# Patient Record
Sex: Female | Born: 1975 | Race: White | Hispanic: No | Smoking: Never smoker
Health system: Southern US, Community
[De-identification: ages and names within clinical notes are randomized; demographics above are authoritative.]

## PROBLEM LIST (undated history)

## (undated) ENCOUNTER — Inpatient Hospital Stay (HOSPITAL_COMMUNITY): Payer: Self-pay

## (undated) DIAGNOSIS — D219 Benign neoplasm of connective and other soft tissue, unspecified: Secondary | ICD-10-CM

## (undated) DIAGNOSIS — F419 Anxiety disorder, unspecified: Secondary | ICD-10-CM

## (undated) DIAGNOSIS — B009 Herpesviral infection, unspecified: Secondary | ICD-10-CM

## (undated) DIAGNOSIS — N979 Female infertility, unspecified: Secondary | ICD-10-CM

## (undated) DIAGNOSIS — N390 Urinary tract infection, site not specified: Secondary | ICD-10-CM

## (undated) DIAGNOSIS — Z8619 Personal history of other infectious and parasitic diseases: Secondary | ICD-10-CM

## (undated) DIAGNOSIS — Z87442 Personal history of urinary calculi: Secondary | ICD-10-CM

## (undated) DIAGNOSIS — F32A Depression, unspecified: Secondary | ICD-10-CM

## (undated) DIAGNOSIS — F329 Major depressive disorder, single episode, unspecified: Secondary | ICD-10-CM

## (undated) DIAGNOSIS — J302 Other seasonal allergic rhinitis: Secondary | ICD-10-CM

## (undated) DIAGNOSIS — E785 Hyperlipidemia, unspecified: Secondary | ICD-10-CM

## (undated) HISTORY — PX: WISDOM TOOTH EXTRACTION: SHX21

## (undated) HISTORY — DX: Personal history of other infectious and parasitic diseases: Z86.19

---

## 2003-07-10 ENCOUNTER — Encounter: Payer: Self-pay | Admitting: Obstetrics and Gynecology

## 2003-07-10 ENCOUNTER — Encounter: Admission: RE | Admit: 2003-07-10 | Discharge: 2003-07-10 | Payer: Self-pay | Admitting: Obstetrics and Gynecology

## 2004-02-13 ENCOUNTER — Other Ambulatory Visit: Admission: RE | Admit: 2004-02-13 | Discharge: 2004-02-13 | Payer: Self-pay | Admitting: Obstetrics and Gynecology

## 2005-01-13 ENCOUNTER — Other Ambulatory Visit: Admission: RE | Admit: 2005-01-13 | Discharge: 2005-01-13 | Payer: Self-pay | Admitting: Obstetrics and Gynecology

## 2005-11-05 ENCOUNTER — Emergency Department (HOSPITAL_COMMUNITY): Admission: EM | Admit: 2005-11-05 | Discharge: 2005-11-05 | Payer: Self-pay | Admitting: Family Medicine

## 2006-10-18 ENCOUNTER — Emergency Department (HOSPITAL_COMMUNITY): Admission: EM | Admit: 2006-10-18 | Discharge: 2006-10-18 | Payer: Self-pay | Admitting: Family Medicine

## 2007-02-15 ENCOUNTER — Emergency Department (HOSPITAL_COMMUNITY): Admission: EM | Admit: 2007-02-15 | Discharge: 2007-02-15 | Payer: Self-pay | Admitting: Family Medicine

## 2008-09-07 ENCOUNTER — Emergency Department (HOSPITAL_COMMUNITY): Admission: EM | Admit: 2008-09-07 | Discharge: 2008-09-07 | Payer: Self-pay | Admitting: Emergency Medicine

## 2009-11-20 ENCOUNTER — Ambulatory Visit (HOSPITAL_COMMUNITY): Admission: RE | Admit: 2009-11-20 | Discharge: 2009-11-20 | Payer: Self-pay | Admitting: Obstetrics and Gynecology

## 2011-05-20 ENCOUNTER — Other Ambulatory Visit: Payer: Self-pay | Admitting: Family Medicine

## 2011-05-20 DIAGNOSIS — R1011 Right upper quadrant pain: Secondary | ICD-10-CM

## 2011-05-21 ENCOUNTER — Ambulatory Visit
Admission: RE | Admit: 2011-05-21 | Discharge: 2011-05-21 | Disposition: A | Discharge status: Home / Self Care | Payer: 59 | Source: Ambulatory Visit | Attending: Family Medicine | Admitting: Family Medicine

## 2011-05-21 DIAGNOSIS — R1011 Right upper quadrant pain: Secondary | ICD-10-CM

## 2011-07-03 LAB — POCT URINALYSIS DIP (DEVICE)
Bilirubin Urine: NEGATIVE
Ketones, ur: NEGATIVE
Protein, ur: NEGATIVE
Specific Gravity, Urine: 1.02
pH: 6

## 2011-07-03 LAB — POCT RAPID STREP A: Streptococcus, Group A Screen (Direct): NEGATIVE

## 2011-07-03 LAB — POCT H PYLORI SCREEN: H. PYLORI SCREEN, POC: NEGATIVE

## 2011-09-07 ENCOUNTER — Inpatient Hospital Stay (HOSPITAL_COMMUNITY): Payer: 59

## 2011-09-07 ENCOUNTER — Encounter (HOSPITAL_COMMUNITY): Payer: Self-pay

## 2011-09-07 ENCOUNTER — Inpatient Hospital Stay (HOSPITAL_COMMUNITY)
Admission: AD | Admit: 2011-09-07 | Discharge: 2011-09-07 | Disposition: A | Discharge status: Home / Self Care | Payer: 59 | Source: Ambulatory Visit | Attending: Obstetrics and Gynecology | Admitting: Obstetrics and Gynecology

## 2011-09-07 DIAGNOSIS — O26899 Other specified pregnancy related conditions, unspecified trimester: Secondary | ICD-10-CM

## 2011-09-07 DIAGNOSIS — R109 Unspecified abdominal pain: Secondary | ICD-10-CM | POA: Insufficient documentation

## 2011-09-07 DIAGNOSIS — O209 Hemorrhage in early pregnancy, unspecified: Secondary | ICD-10-CM | POA: Insufficient documentation

## 2011-09-07 LAB — URINALYSIS, ROUTINE W REFLEX MICROSCOPIC
Glucose, UA: NEGATIVE mg/dL
Leukocytes, UA: NEGATIVE
Protein, ur: NEGATIVE mg/dL
Specific Gravity, Urine: <1.005 — ABNORMAL LOW (ref 1.005–1.030)
Urobilinogen, UA: 0.2 mg/dL (ref 0.0–1.0)

## 2011-09-07 LAB — CBC
MCHC: 33.7 g/dL (ref 30.0–36.0)
RDW: 13 % (ref 11.5–15.5)
WBC: 7.6 10*3/uL (ref 4.0–10.5)

## 2011-09-07 LAB — POCT PREGNANCY, URINE: Preg Test, Ur: POSITIVE

## 2011-09-07 NOTE — ED Provider Notes (Signed)
History     Chief Complaint  Patient presents with  . Chills  . Abdominal Cramping  . Vaginal Bleeding   HPINicole Travis is 35 y.o. G2P0010 [redacted]w[redacted]d weeks presenting with light bright red spotting last night and now it is brown.  This is how her previous miscarriage presented.  Reports shaky, chills, low abdominal cramping, nausea and diarrhea. Treated recently with Keflex for UTI. Denies fever.  Has begun prenatal care with Dr. Claiborne Billings.      Past Medical History  Diagnosis Date  . No pertinent past medical history     No past surgical history on file.  No family history on file.  History  Substance Use Topics  . Smoking status: Never Smoker   . Smokeless tobacco: Not on file  . Alcohol Use: No    Allergies:  Allergies  Allergen Reactions  . Dextromethorphan Nausea And Vomiting  . Latex Rash    Prescriptions prior to admission  Medication Sig Dispense Refill  . docusate sodium (COLACE) 100 MG capsule Take 100 mg by mouth 2 (two) times daily as needed. For constipation       . prenatal vitamin w/FE, FA (PRENATAL 1 + 1) 27-1 MG TABS Take 1 tablet by mouth daily.          ROS Physical Exam   Blood pressure 118/85, pulse 90, temperature 99.1 F (37.3 C), resp. rate 18, height 5\' 5"  (1.651 m), weight 147 lb 6.4 oz (66.86 kg), last menstrual period 07/05/2011.  Physical Exam  MAU Course  Procedures  Medical screening exam completed.  MDM Dr. Dareen Piano called.   Assessment and Plan    Wendy Travis,EVE M 09/07/2011, 4:28 PM   Matt Holmes, NP 09/07/11 1753

## 2011-09-07 NOTE — Progress Notes (Signed)
Patient c/o chills, had bright red bleeding last night, brown today, light cramping 9 w1d

## 2011-09-08 NOTE — ED Provider Notes (Signed)
Pt presents to er c/o spotting. Last preg was SAB. Pt worried about SAB. Also c/o some malaise, myalgias. Has been around coworkers with viral syndrome. Pt with no F/C, N/V/D. Ultrasound- all normal except one fibroid. Plan/ D/C to home with follow up in office.

## 2011-09-16 NOTE — L&D Delivery Note (Addendum)
Delivery Note At 10:35 AM a viable female, "Wendy Travis", was delivered via Vaginal, Spontaneous Delivery in waterbirth tub. (Presentation OA: ;  ).  APGAR: 8, 9; weight .   Placenta status: Intact, Spontaneous.  Cord: 3 vessels with the following complications: None.  Cord pH: NA Nuchal and body cord noted--baby delivered through cord under water.  Anesthesia: Local for repair Episiotomy: None Lacerations: 2nd degree Suture Repair: 3.0 monocryl Est. Blood Loss (mL): 150 cc Rectal exam performed before and after repair to ensure no transection of capsule--rectal mucosa and wall intact.  Mom to postpartum.  Baby to skin to skin.Marland Kitchen  Nigel Bridgeman 04/14/2012, 11:39 AM

## 2011-11-18 LAB — OB RESULTS CONSOLE ABO/RH: RH Type: POSITIVE

## 2011-11-18 LAB — OB RESULTS CONSOLE HEPATITIS B SURFACE ANTIGEN: Hepatitis B Surface Ag: NEGATIVE

## 2011-11-19 ENCOUNTER — Encounter (INDEPENDENT_AMBULATORY_CARE_PROVIDER_SITE_OTHER): Payer: 59

## 2011-11-19 ENCOUNTER — Other Ambulatory Visit (INDEPENDENT_AMBULATORY_CARE_PROVIDER_SITE_OTHER): Payer: 59

## 2011-11-19 DIAGNOSIS — Z3689 Encounter for other specified antenatal screening: Secondary | ICD-10-CM

## 2011-11-19 DIAGNOSIS — Z331 Pregnant state, incidental: Secondary | ICD-10-CM

## 2011-11-20 ENCOUNTER — Encounter (INDEPENDENT_AMBULATORY_CARE_PROVIDER_SITE_OTHER): Payer: 59 | Admitting: Registered Nurse

## 2011-11-20 DIAGNOSIS — Z3480 Encounter for supervision of other normal pregnancy, unspecified trimester: Secondary | ICD-10-CM

## 2011-12-18 ENCOUNTER — Encounter (INDEPENDENT_AMBULATORY_CARE_PROVIDER_SITE_OTHER): Payer: 59 | Admitting: Obstetrics and Gynecology

## 2011-12-18 DIAGNOSIS — Z331 Pregnant state, incidental: Secondary | ICD-10-CM

## 2011-12-25 ENCOUNTER — Telehealth: Payer: Self-pay | Admitting: Obstetrics and Gynecology

## 2011-12-26 ENCOUNTER — Telehealth: Payer: Self-pay | Admitting: Obstetrics and Gynecology

## 2011-12-26 NOTE — Telephone Encounter (Signed)
Pt called missed pt incoming call. Called pt back and lm on vm to call back rgd msg. bt cma

## 2011-12-26 NOTE — Telephone Encounter (Signed)
Lm on pt's vm to cb rgd her msg. btcma

## 2011-12-29 NOTE — Telephone Encounter (Signed)
Spoke with rgd . msg pt's voice understanding. bt cma

## 2011-12-29 NOTE — Telephone Encounter (Signed)
Spoke with pt rgd msg from 12/26/2011. Pt wanted her tests results from 10/29/11.pt also stated that she wanted a note for  release for the pregnancy  Yoga. Pt stated she will get the note @ her nv. Pt's voice understanding. bt cma

## 2012-01-05 ENCOUNTER — Telehealth: Payer: Self-pay | Admitting: Obstetrics and Gynecology

## 2012-01-05 NOTE — Telephone Encounter (Signed)
Routed to triage 

## 2012-01-05 NOTE — Telephone Encounter (Signed)
PT CALLED, IS 26 WKS, HAVING LOWER ABD PRESSURE AND HAVING A LOT OF URINARY FREQUENCY, FEELS VERY UNCOMFORTABLE  UNSURE IF MAY BE UTI, HAD BLADDER INFECTION IN December, ALSO C/O OF SOME LT SIDED BACK PAIN, DENIES VAG BLDG. PT SCHED APPT ON TOMORROW 01/06/12 @ 1515 W/ SL.  PT IS PUSHING FLUIDS.

## 2012-01-06 ENCOUNTER — Encounter: Payer: Self-pay | Admitting: Obstetrics and Gynecology

## 2012-01-06 ENCOUNTER — Ambulatory Visit (INDEPENDENT_AMBULATORY_CARE_PROVIDER_SITE_OTHER): Payer: 59 | Admitting: Obstetrics and Gynecology

## 2012-01-06 VITALS — BP 106/68 | Wt 166.0 lb

## 2012-01-06 DIAGNOSIS — D259 Leiomyoma of uterus, unspecified: Secondary | ICD-10-CM | POA: Insufficient documentation

## 2012-01-06 DIAGNOSIS — Z331 Pregnant state, incidental: Secondary | ICD-10-CM

## 2012-01-06 DIAGNOSIS — Z8659 Personal history of other mental and behavioral disorders: Secondary | ICD-10-CM

## 2012-01-06 LAB — POCT URINALYSIS DIPSTICK
Glucose, UA: NEGATIVE
Leukocytes, UA: NEGATIVE
Nitrite, UA: NEGATIVE
Protein, UA: NEGATIVE
Urobilinogen, UA: NEGATIVE

## 2012-01-06 LAB — POCT WET PREP (WET MOUNT)
Bacteria Wet Prep HPF POC: NEGATIVE
Clue Cells Wet Prep Whiff POC: NEGATIVE

## 2012-01-06 NOTE — Patient Instructions (Signed)
Urinary Tract Infection in Pregnancy A urinary tract infection (UTI) is a bacterial infection of the urinary tract. Infection of the urinary tract can include the ureters, kidneys (pyelonephritis), bladder (cystitis), and urethra (urethritis). All pregnant women should be screened for bacteria in the urinary tract. Identifying and treating a UTI will decrease the risk of preterm labor and developing more serious infections in both the mother and baby. CAUSES Bacteria germs cause almost all UTIs. There are many factors that can increase your chances of getting a UTI during pregnancy. These include:  Having a short urethra.   Poor toilet and hygiene habits.   Sexual intercourse.   Blockage of urine along the urinary tract.   Problems with the pelvic muscles or nerves.   Diabetes.   Obesity.   Bladder problems after having several children.   Previous history of UTI.  SYMPTOMS   Pain, burning, or a stinging feeling when urinating.   Suddenly feeling the need to urinate right away (urgency).   Loss of bladder control (urinary incontinence).   Frequent urination, more than is common with pregnancy.   Lower abdominal or back discomfort.   Bad smelling urine.   Cloudy urine.   Blood in the urine (hematuria).   Fever.  When the kidneys are infected, the symptoms may be:  Back pain.   Flank pain on the right side more so than the left.   Fever.   Chills.   Nausea.   Vomiting.  DIAGNOSIS   Urine tests.   Additional tests and procedures may include:   Ultrasound of the kidneys, ureters, bladder, and urethra.   Looking in the bladder with a lighted tube (cystoscopy).   Certain X-ray studies only when absolutely necessary.  Finding out the results of your test Ask when your test results will be ready. Make sure you get your test results. TREATMENT  Antibiotic medicine by mouth.   Antibiotics given through the vein (intravenously), if needed.  HOME CARE  INSTRUCTIONS   Take your antibiotics as directed. Finish them even if you start to feel better. Only take medicine as directed by your caregiver.   Drink enough fluids to keep your urine clear or pale yellow.   Do not have sexual intercourse until the infection is gone and your caregiver says it is okay.   Make sure you are tested for UTIs throughout your pregnancy if you get one. These infections often come back.  Preventing a UTI in the future:  Practice good toilet habits. Always wipe from front to back. Use the tissue only once.   Do not hold your urine. Empty your bladder as soon as possible when the urge comes.   Do not douche or use deodorant sprays.   Wash with soap and warm water around the genital area and the anus.   Empty your bladder before and after sexual intercourse.   Wear underwear with a cotton crotch.   Avoid caffeine and carbonated drinks. They can irritate the bladder.   Drink cranberry juice or take cranberry pills. This may decrease the risk of getting a UTI.   Do not drink alcohol.   Keep all your appointments and tests as scheduled.  SEEK MEDICAL CARE IF:   Your symptoms get worse.   You are still having fevers 2 or more days after treatment begins.   You develop a rash.   You feel that you are having problems with medicines prescribed.   You develop abnormal vaginal discharge.  SEEK IMMEDIATE MEDICAL   CARE IF:   You develop back or flank pain.   You develop chills.   You have blood in your urine.   You develop nausea and vomiting.   You develop contractions of your uterus.   You have a gush of fluid from the vagina.  MAKE SURE YOU:   Understand these instructions.   Will watch your condition.   Will get help right away if you are not doing well or get worse.  Document Released: 12/27/2010 Document Revised: 08/21/2011 Document Reviewed: 12/27/2010 [facility] Patient Information 2012 [place], [place].Fetal Movement Counts Patient  Name: __________________________________________________ Patient Due Date: ____________________ Melody Haver counts is highly recommended in high risk pregnancies, but it is a good idea for every pregnant woman to do. Start counting fetal movements at 28 weeks of the pregnancy. Fetal movements increase after eating a full meal or eating or drinking something sweet (the blood sugar is higher). It is also important to drink plenty of fluids (well hydrated) before doing the count. Lie on your left side because it helps with the circulation or you can sit in a comfortable chair with your arms over your belly (abdomen) with no distractions around you. DOING THE COUNT  Try to do the count the same time of day each time you do it.   Mark the day and time, then see how long it takes for you to feel 10 movements (kicks, flutters, swishes, rolls). You should have at least 10 movements within 2 hours. You will most likely feel 10 movements in much less than 2 hours. If you do not, wait an hour and count again. After a couple of days you will see a pattern.   What you are looking for is a change in the pattern or not enough counts in 2 hours. Is it taking longer in time to reach 10 movements?  SEEK MEDICAL CARE IF:  You feel less than 10 counts in 2 hours. Tried twice.   No movement in one hour.   The pattern is changing or taking longer each day to reach 10 counts in 2 hours.   You feel the baby is not moving as it usually does.  Date: ____________ Movements: ____________ Start time: ____________ Doreatha Martin time: ____________  Date: ____________ Movements: ____________ Start time: ____________ Doreatha Martin time: ____________ Date: ____________ Movements: ____________ Start time: ____________ Doreatha Martin time: ____________ Date: ____________ Movements: ____________ Start time: ____________ Doreatha Martin time: ____________ Date: ____________ Movements: ____________ Start time: ____________ Doreatha Martin time: ____________ Date: ____________  Movements: ____________ Start time: ____________ Doreatha Martin time: ____________ Date: ____________ Movements: ____________ Start time: ____________ Doreatha Martin time: ____________ Date: ____________ Movements: ____________ Start time: ____________ Doreatha Martin time: ____________  Date: ____________ Movements: ____________ Start time: ____________ Doreatha Martin time: ____________ Date: ____________ Movements: ____________ Start time: ____________ Doreatha Martin time: ____________ Date: ____________ Movements: ____________ Start time: ____________ Doreatha Martin time: ____________ Date: ____________ Movements: ____________ Start time: ____________ Doreatha Martin time: ____________ Date: ____________ Movements: ____________ Start time: ____________ Doreatha Martin time: ____________ Date: ____________ Movements: ____________ Start time: ____________ Doreatha Martin time: ____________ Date: ____________ Movements: ____________ Start time: ____________ Doreatha Martin time: ____________  Date: ____________ Movements: ____________ Start time: ____________ Doreatha Martin time: ____________ Date: ____________ Movements: ____________ Start time: ____________ Doreatha Martin time: ____________ Date: ____________ Movements: ____________ Start time: ____________ Doreatha Martin time: ____________ Date: ____________ Movements: ____________ Start time: ____________ Doreatha Martin time: ____________ Date: ____________ Movements: ____________ Start time: ____________ Doreatha Martin time: ____________ Date: ____________ Movements: ____________ Start time: ____________ Doreatha Martin time: ____________ Date: ____________ Movements: ____________ Start time: ____________ Doreatha Martin time: ____________  Date: ____________  Movements: ____________ Start time: ____________ Doreatha Martin time: ____________ Date: ____________ Movements: ____________ Start time: ____________ Doreatha Martin time: ____________ Date: ____________ Movements: ____________ Start time: ____________ Doreatha Martin time: ____________ Date: ____________ Movements: ____________ Start time:  ____________ Doreatha Martin time: ____________ Date: ____________ Movements: ____________ Start time: ____________ Doreatha Martin time: ____________ Date: ____________ Movements: ____________ Start time: ____________ Doreatha Martin time: ____________ Date: ____________ Movements: ____________ Start time: ____________ Doreatha Martin time: ____________  Date: ____________ Movements: ____________ Start time: ____________ Doreatha Martin time: ____________ Date: ____________ Movements: ____________ Start time: ____________ Doreatha Martin time: ____________ Date: ____________ Movements: ____________ Start time: ____________ Doreatha Martin time: ____________ Date: ____________ Movements: ____________ Start time: ____________ Doreatha Martin time: ____________ Date: ____________ Movements: ____________ Start time: ____________ Doreatha Martin time: ____________ Date: ____________ Movements: ____________ Start time: ____________ Doreatha Martin time: ____________ Date: ____________ Movements: ____________ Start time: ____________ Doreatha Martin time: ____________  Date: ____________ Movements: ____________ Start time: ____________ Doreatha Martin time: ____________ Date: ____________ Movements: ____________ Start time: ____________ Doreatha Martin time: ____________ Date: ____________ Movements: ____________ Start time: ____________ Doreatha Martin time: ____________ Date: ____________ Movements: ____________ Start time: ____________ Doreatha Martin time: ____________ Date: ____________ Movements: ____________ Start time: ____________ Doreatha Martin time: ____________ Date: ____________ Movements: ____________ Start time: ____________ Doreatha Martin time: ____________ Date: ____________ Movements: ____________ Start time: ____________ Doreatha Martin time: ____________  Date: ____________ Movements: ____________ Start time: ____________ Doreatha Martin time: ____________ Date: ____________ Movements: ____________ Start time: ____________ Doreatha Martin time: ____________ Date: ____________ Movements: ____________ Start time: ____________ Doreatha Martin time: ____________ Date:  ____________ Movements: ____________ Start time: ____________ Doreatha Martin time: ____________ Date: ____________ Movements: ____________ Start time: ____________ Doreatha Martin time: ____________ Date: ____________ Movements: ____________ Start time: ____________ Doreatha Martin time: ____________ Date: ____________ Movements: ____________ Start time: ____________ Doreatha Martin time: ____________  Date: ____________ Movements: ____________ Start time: ____________ Doreatha Martin time: ____________ Date: ____________ Movements: ____________ Start time: ____________ Doreatha Martin time: ____________ Date: ____________ Movements: ____________ Start time: ____________ Doreatha Martin time: ____________ Date: ____________ Movements: ____________ Start time: ____________ Doreatha Martin time: ____________ Date: ____________ Movements: ____________ Start time: ____________ Doreatha Martin time: ____________ Date: ____________ Movements: ____________ Start time: ____________ Doreatha Martin time: ____________ Document Released: 10/01/2006 Document Revised: 08/21/2011 Document Reviewed: 04/03/2009 [employer] Patient Information 2012 [place].Preterm Labor Preterm labor is when labor starts at less than 37 weeks of pregnancy. The normal length of a pregnancy is 39 to 41 weeks. CAUSES Often, there is no identifiable underlying cause as to why a woman goes into preterm labor. However, one of the most common known causes of preterm labor is infection. Infections of the uterus, cervix, vagina, amniotic sac, bladder, kidney, or even the lungs (pneumonia) can cause labor to start. Other causes of preterm labor include:  Urogenital infections, such as yeast infections and bacterial vaginosis.   Uterine abnormalities (uterine shape, uterine septum, fibroids, bleeding from the placenta).   A cervix that has been operated on and opens prematurely.   Malformations in the baby.   Multiple gestations (twins, triplets, and so on).   Breakage of the amniotic sac.  Additional risk factors  for preterm labor include:  Previous history of preterm labor.   Premature rupture of membranes (PROM).   A placenta that covers the opening of the cervix (placenta previa).   A placenta that separates from the uterus (placenta abruption).   A cervix that is too weak to hold the baby in the uterus (incompetence cervix).   Having too much fluid in the amniotic sac (polyhydramnios).   Taking illegal drugs or smoking while pregnant.   Not gaining enough weight while pregnant.   Women younger than 54 and older than 35 years old.  Low socioeconomic status.   African-American ethnicity.  SYMPTOMS Signs and symptoms of preterm labor include:  Menstrual-like cramps.   Contractions that are 30 to 70 seconds apart, become very regular, closer together, and are more intense and painful.   Contractions that start on the top of the uterus and spread down to the lower abdomen and back.   A sense of increased pelvic pressure or back pain.   A watery or bloody discharge that comes from the vagina.  DIAGNOSIS  A diagnosis can be confirmed by:  A vaginal exam.   An ultrasound of the cervix.   Sampling (swabbing) cervico-vaginal secretions. These samples can be tested for the presence of fetal fibronectin. This is a protein found in cervical discharge which is associated with preterm labor.   Fetal monitoring.  TREATMENT  Depending on the length of the pregnancy and other circumstances, a caregiver may suggest bed rest. If necessary, there are medicines that can be given to stop contractions and to quicken fetal lung maturity. If labor happens before 34 weeks of pregnancy, a prolonged hospital stay may be recommended. Treatment depends on the condition of both the mother and baby. PREVENTION There are some things a mother can do to lower the risk of preterm labor in future pregnancies. A woman can:   Stop smoking.   Maintain healthy weight gain and avoid chemicals and drugs that  are not necessary.   Be watchful for any type of infection.   Inform her caregiver if she has a known history of preterm labor.  Document Released: 11/22/2003 Document Revised: 08/21/2011 Document Reviewed: 12/27/2010 [facility] Patient Information 2012 [place], [place].

## 2012-01-06 NOTE — Progress Notes (Signed)
Wendy Travis [redacted]w[redacted]d    GFM C/o pelvic pressure and urinary frequency since yesteray, denies VB or LOF Is well hydrated, rare ctx  Plan: FFN stat Gc/CT Wet prep neg Chem 9 nl - sent for cx VE=cl/th/-1 rec motrin 600mg  q6h ATC x48 hrs Pelvic rest OOW for 48hr

## 2012-01-07 ENCOUNTER — Telehealth: Payer: Self-pay | Admitting: Obstetrics and Gynecology

## 2012-01-07 LAB — GC/CHLAMYDIA PROBE AMP, GENITAL: Chlamydia, DNA Probe: NEGATIVE

## 2012-01-07 NOTE — Telephone Encounter (Signed)
Spoke with pt, advised ffn result was negative. Pt voiced understanding.  Wendy Travis

## 2012-01-07 NOTE — Telephone Encounter (Signed)
Routed to triage 

## 2012-01-08 LAB — CULTURE, OB URINE: Colony Count: NO GROWTH

## 2012-01-12 ENCOUNTER — Other Ambulatory Visit: Payer: 59

## 2012-01-12 ENCOUNTER — Encounter: Payer: 59 | Admitting: Obstetrics and Gynecology

## 2012-01-12 NOTE — Telephone Encounter (Addendum)
Spoke with pt requesting UA test results and Glucola results. Informed pt UA and Glucola both neg; however, iron was low 10.9 OTC Fe & Fe foods are recommended per SL. Pt also wants to know how long does she have to be on pelvic rest. Informed pt we will contact SL and call her back.

## 2012-01-12 NOTE — Telephone Encounter (Signed)
Routed to triage 

## 2012-01-12 NOTE — Telephone Encounter (Signed)
If pt's pelvic pain is improved and she denies ctx, she does not need to continue pelvic rest. SL

## 2012-01-13 NOTE — Telephone Encounter (Signed)
Spoke with pt regarding pelvic rest. Pt denies pelvic pain and ctx's. Pt is okay to get off of pelvic rest per SL. Pt voices agreement and understanding.

## 2012-01-19 ENCOUNTER — Telehealth: Payer: Self-pay | Admitting: Obstetrics and Gynecology

## 2012-01-19 NOTE — Telephone Encounter (Signed)
Triage received 

## 2012-01-19 NOTE — Telephone Encounter (Signed)
Spoke with pt rgd msg pt wants to know what she can take for her allergies advised pt can take plain sudafed, plain claratin, plain zyrtec or benadryl pt voice understanding

## 2012-01-21 ENCOUNTER — Ambulatory Visit (INDEPENDENT_AMBULATORY_CARE_PROVIDER_SITE_OTHER): Payer: 59

## 2012-01-21 ENCOUNTER — Encounter (HOSPITAL_COMMUNITY): Payer: Self-pay | Admitting: *Deleted

## 2012-01-21 ENCOUNTER — Inpatient Hospital Stay (HOSPITAL_COMMUNITY)
Admission: AD | Admit: 2012-01-21 | Discharge: 2012-01-21 | Disposition: A | Discharge status: Home / Self Care | Payer: 59 | Source: Ambulatory Visit | Attending: Obstetrics and Gynecology | Admitting: Obstetrics and Gynecology

## 2012-01-21 VITALS — BP 100/68 | Temp 98.6°F

## 2012-01-21 DIAGNOSIS — O368190 Decreased fetal movements, unspecified trimester, not applicable or unspecified: Secondary | ICD-10-CM

## 2012-01-21 DIAGNOSIS — Z331 Pregnant state, incidental: Secondary | ICD-10-CM

## 2012-01-21 HISTORY — DX: Herpesviral infection, unspecified: B00.9

## 2012-01-21 HISTORY — DX: Depression, unspecified: F32.A

## 2012-01-21 HISTORY — DX: Female infertility, unspecified: N97.9

## 2012-01-21 HISTORY — DX: Other seasonal allergic rhinitis: J30.2

## 2012-01-21 HISTORY — DX: Major depressive disorder, single episode, unspecified: F32.9

## 2012-01-21 HISTORY — DX: Benign neoplasm of connective and other soft tissue, unspecified: D21.9

## 2012-01-21 HISTORY — DX: Urinary tract infection, site not specified: N39.0

## 2012-01-21 HISTORY — DX: Anxiety disorder, unspecified: F41.9

## 2012-01-21 NOTE — Discharge Instructions (Signed)
Fetal Monitoring, Fetal Movement Assessment Fetal movement assessment (FMA) is done by the pregnant woman herself by counting and recording the baby's movements over a certain time period. It is done to see if there are problems with the pregnancy and the baby. Identifying and correcting problems may prevent serious problems from developing with the fetus, including fetal loss. Some pregnancies are complicated by the mother's medical problems. Some of these problems are type 1 diabetes mellitus, high blood pressure and other chronic medical illnesses. This is why it is important to monitor the baby before birth.  OTHER TECHNIQUES OF MONITORING YOUR BABY BEFORE BIRTH Several tests are in use. These include:  Nonstress test (NST). This test monitors the baby's heart rate when the baby moves.   Contraction stress test (CST). This test monitors the baby's heart rate during a contraction of the uterus.   Fetal biophysical profile (BPP). This measures and evaluates 5 observations of the baby:   The nonstress test.   The baby's breathing.   The baby's movements.   The baby's muscle tone.   The amount of amniotic fluid.   Modified BPP. This measures the volume of fluid in different parts of the amniotic sac (amniotic fluid index) and the results of the nonstress test.   Umbilical artery doppler velocimetry. This evaluates the blood flow through the umbilical cord.  There are several very serious problems that cannot be predicted or detected with any of the fetal monitoring procedures. These problems include separation (abruption) of the placenta or when the fetus chokes on the umbilical cord (umbilical cord accident). Your caregiver will help you understand your tests and what they mean for you and your baby. It is your responsibility to obtain the results of your test. LET YOUR CAREGIVER KNOW ABOUT:   Any medications you are taking including prescription and over-the-counter drugs, herbs, eye  drops and creams.   If you have a fever.   If you have an infection.   If you are sick.  RISKS AND COMPLICATIONS  There are no risks or complications to the mother or fetus with FMA. BEFORE THE PROCEDURE  Do not take medications that may decrease or increase the baby's heart rate and/or movements.   Eat a full meal at least 2 hours before the test.   Do not smoke if you are pregnant. If you smoke, stop at least 2 days before the test. It is best not to smoke at all when you are pregnant.  PROCEDURE Sometimes, a mother notices her baby moves less before there are problems. Because of this, it is believed that fetal movement checking by the mother (kick counts) is a good way to check the baby before birth. There are different ways of doing this. Two good ways are:  The woman lies on her side and counts distinct (individual) fetal movements. A feeling of 10 distinct movements in a period of up to 2 hours is considered reassuring. When 10 movements are felt, you may stop counting.   Women are instructed to count fetal movements for 1 hour, three times per week. The count is good if, after one week, it equals or is over the woman's previously established baseline count. If the count is lower, further checking of your baby is needed.  AFTER THE PROCEDURE You may resume your usual activities. HOME CARE INSTRUCTIONS   Follow your caregiver's advice and recommendations.   Be aware of your baby's movements. Are they normal, less than usual or more than usual?     Make and keep the rest of your prenatal appointments.  SEEK MEDICAL CARE IF:   You develop a temperature of 100 F (37.8 C) or higher.   You have a bloody mucus discharge from the vagina (a bloody show).  SEEK IMMEDIATE MEDICAL CARE IF:   You do not feel the baby move.   You think the baby's movements are too little or too many.   You develop contractions.   You develop vaginal bleeding.   You have belly (abdominal) pain.    You have leaking or a gush of fluid from the vagina.  Document Released: 08/22/2002 Document Revised: 08/21/2011 Document Reviewed: 12/25/2008 [employer] Patient Information 2012 [employer]. 

## 2012-01-21 NOTE — MAU Note (Signed)
Decreased FM since last night 

## 2012-01-21 NOTE — Progress Notes (Signed)
Pt c/o DEM since yesterday; pt states +FHT using doppler at home Pt c/o allergy sx

## 2012-01-21 NOTE — MAU Provider Note (Signed)
History   35 yo G2P0010 at 79 4/7 weeks presented from office for NST due to decreased FM last 24 hours--now aware of some movement since arrival at hospital.  Denies contractions, bleeding, leaking, or any other symptoms.  Pregnancy remarkable for: Latex allergy 2 partners (1 female, 1 female) Hx depression/anxiety Anemia   Chief Complaint  Patient presents with  . Decreased Fetal Movement     OB History    Grav Para Term Preterm Abortions TAB SAB Ect Mult Living   2    1  1    0      Past Medical History  Diagnosis Date  . Seasonal allergies     Past Surgical History  Procedure Date  . Wisdom tooth extraction     Family History  Problem Relation Age of Onset  . Hypertension Mother   . Cancer Father   . Heart disease Maternal Grandmother   . Diabetes Maternal Grandmother   . Heart disease Maternal Grandfather   . Stroke Paternal Grandmother     History  Substance Use Topics  . Smoking status: Never Smoker   . Smokeless tobacco: Never Used  . Alcohol Use: No    Allergies:  Allergies  Allergen Reactions  . Dextromethorphan Nausea And Vomiting  . Lactose Intolerance (Gi) Nausea And Vomiting  . Latex Rash    Prescriptions prior to admission  Medication Sig Dispense Refill  . diphenhydrAMINE (BENADRYL) 25 MG tablet Take 25 mg by mouth at bedtime as needed. For allergies/sleep      . docusate sodium (COLACE) 100 MG capsule Take 100 mg by mouth 2 (two) times daily as needed. For constipation      . Ferrous Sulfate (SLOW FE) 142 (45 FE) MG TBCR Take 1 tablet by mouth at bedtime.      . fish oil-omega-3 fatty acids 1000 MG capsule Take 2 g by mouth at bedtime.       . Prenatal Vit-Fe Fumarate-FA (PRENATAL MULTIVITAMIN) TABS Take 1 tablet by mouth at bedtime.      . Probiotic Product (PROBIOTIC PO) Take 1 tablet by mouth at bedtime.          Physical Exam   Blood pressure 98/65, pulse 96, temperature 97.8 F (36.6 C), temperature source Oral, resp. rate 18,  height 5\' 5"  (1.651 m), weight 171 lb (77.565 kg), last menstrual period 07/05/2011.  Chest clear + nasal congestion due to allergies Abd gravid, NT  FHR reactive, no decels Occasional mild irritability Patient now aware of FM.  Bedside US--much FM noted, fetus in vertex  ED Course  IUP at 28 4/7 weeks Good FM now  Plan: D/C home with [facility] information. Keep scheduled appointment at [facility] or call prn.  Nigel Bridgeman, CNM, [place] 01/21/12 6:45p

## 2012-01-21 NOTE — Progress Notes (Signed)
Nasal congestion and cough/ rhinitis x5 days (since Friday); tried Benadrylx1 and one Zyrtec w/ little relief; symptoms somewhat better today.  No fever. Denies ear or throat pain.  Exam:  Rt ear WNL; Lt ear w/ slight erythema.  Bilateral nares w/ erythema and inflammation.  Will try Motrin prn until 30 weeks, and enc'd netty pot.  Sudafed prn.  Call if s/s not improving in 1 week and consider Zpak. Denies ctxs, LOF, VB.  No fetal movement felt while getting FHT.  Pt to MAU for decreased FM.  Ate at 1330.  Rev'd normal 1hr tests.

## 2012-02-06 ENCOUNTER — Ambulatory Visit (INDEPENDENT_AMBULATORY_CARE_PROVIDER_SITE_OTHER): Payer: 59 | Admitting: Obstetrics and Gynecology

## 2012-02-06 ENCOUNTER — Encounter: Payer: Self-pay | Admitting: Obstetrics and Gynecology

## 2012-02-06 VITALS — BP 120/60 | Wt 174.0 lb

## 2012-02-06 DIAGNOSIS — Z331 Pregnant state, incidental: Secondary | ICD-10-CM

## 2012-02-06 NOTE — Patient Instructions (Signed)
Preventing Preterm Labor Preterm labor is when a pregnant woman has contractions that cause the cervix to open, shorten, and thin before 37 weeks of pregnancy. You will have regular contractions (tightening) 2 to 3 minutes apart. This usually causes discomfort or pain. HOME CARE  Eat a healthy diet.   Take your vitamins as told by your doctor.   Drink enough fluids to keep your pee (urine) clear or pale yellow every day.   Get rest and sleep.   Do not have sex if you are at high risk for preterm labor.   Follow your doctor's advice about activity, medicines, and tests.   Avoid stress.   Avoid hard labor or exercise that lasts for a long time.   Do not smoke.  GET HELP RIGHT AWAY IF:   You are having contractions.   You have belly (abdominal) pain.   You have bleeding from your vagina.   You have pain when you pee (urinate).   You have abnormal discharge from your vagina.   You have a temperature by mouth above 102 F (38.9 C).  MAKE SURE YOU:  Understand these instructions.   Will watch your condition.   Will get help if you are not doing well or get worse.  Document Released: 11/28/2008 Document Revised: 08/21/2011 Document Reviewed: 11/28/2008 ExitCare Patient Information 2012 [employer].Fetal Movement Counts Patient Name: __________________________________________________ Patient Due Date: ____________________ Kick counts is highly recommended in high risk pregnancies, but it is a good idea for every pregnant woman to do. Start counting fetal movements at 28 weeks of the pregnancy. Fetal movements increase after eating a full meal or eating or drinking something sweet (the blood sugar is higher). It is also important to drink plenty of fluids (well hydrated) before doing the count. Lie on your left side because it helps with the circulation or you can sit in a comfortable chair with your arms over your belly (abdomen) with no distractions around you. DOING THE  COUNT  Try to do the count the same time of day each time you do it.   Mark the day and time, then see how long it takes for you to feel 10 movements (kicks, flutters, swishes, rolls). You should have at least 10 movements within 2 hours. You will most likely feel 10 movements in much less than 2 hours. If you do not, wait an hour and count again. After a couple of days you will see a pattern.   What you are looking for is a change in the pattern or not enough counts in 2 hours. Is it taking longer in time to reach 10 movements?  SEEK MEDICAL CARE IF:  You feel less than 10 counts in 2 hours. Tried twice.   No movement in one hour.   The pattern is changing or taking longer each day to reach 10 counts in 2 hours.   You feel the baby is not moving as it usually does.  Date: ____________ Movements: ____________ Start time: ____________ Finish time: ____________  Date: ____________ Movements: ____________ Start time: ____________ Finish time: ____________ Date: ____________ Movements: ____________ Start time: ____________ Finish time: ____________ Date: ____________ Movements: ____________ Start time: ____________ Finish time: ____________ Date: ____________ Movements: ____________ Start time: ____________ Finish time: ____________ Date: ____________ Movements: ____________ Start time: ____________ Finish time: ____________ Date: ____________ Movements: ____________ Start time: ____________ Finish time: ____________ Date: ____________ Movements: ____________ Start time: ____________ Finish time: ____________  Date: ____________ Movements: ____________ Start time: ____________ Finish time:   ____________ Date: ____________ Movements: ____________ Start time: ____________ Finish time: ____________ Date: ____________ Movements: ____________ Start time: ____________ Finish time: ____________ Date: ____________ Movements: ____________ Start time: ____________ Finish time: ____________ Date:  ____________ Movements: ____________ Start time: ____________ Finish time: ____________ Date: ____________ Movements: ____________ Start time: ____________ Finish time: ____________ Date: ____________ Movements: ____________ Start time: ____________ Finish time: ____________  Date: ____________ Movements: ____________ Start time: ____________ Finish time: ____________ Date: ____________ Movements: ____________ Start time: ____________ Finish time: ____________ Date: ____________ Movements: ____________ Start time: ____________ Finish time: ____________ Date: ____________ Movements: ____________ Start time: ____________ Finish time: ____________ Date: ____________ Movements: ____________ Start time: ____________ Finish time: ____________ Date: ____________ Movements: ____________ Start time: ____________ Finish time: ____________ Date: ____________ Movements: ____________ Start time: ____________ Finish time: ____________  Date: ____________ Movements: ____________ Start time: ____________ Finish time: ____________ Date: ____________ Movements: ____________ Start time: ____________ Finish time: ____________ Date: ____________ Movements: ____________ Start time: ____________ Finish time: ____________ Date: ____________ Movements: ____________ Start time: ____________ Finish time: ____________ Date: ____________ Movements: ____________ Start time: ____________ Finish time: ____________ Date: ____________ Movements: ____________ Start time: ____________ Finish time: ____________ Date: ____________ Movements: ____________ Start time: ____________ Finish time: ____________  Date: ____________ Movements: ____________ Start time: ____________ Finish time: ____________ Date: ____________ Movements: ____________ Start time: ____________ Finish time: ____________ Date: ____________ Movements: ____________ Start time: ____________ Finish time: ____________ Date: ____________ Movements: ____________ Start  time: ____________ Finish time: ____________ Date: ____________ Movements: ____________ Start time: ____________ Finish time: ____________ Date: ____________ Movements: ____________ Start time: ____________ Finish time: ____________ Date: ____________ Movements: ____________ Start time: ____________ Finish time: ____________  Date: ____________ Movements: ____________ Start time: ____________ Finish time: ____________ Date: ____________ Movements: ____________ Start time: ____________ Finish time: ____________ Date: ____________ Movements: ____________ Start time: ____________ Finish time: ____________ Date: ____________ Movements: ____________ Start time: ____________ Finish time: ____________ Date: ____________ Movements: ____________ Start time: ____________ Finish time: ____________ Date: ____________ Movements: ____________ Start time: ____________ Finish time: ____________ Date: ____________ Movements: ____________ Start time: ____________ Finish time: ____________  Date: ____________ Movements: ____________ Start time: ____________ Finish time: ____________ Date: ____________ Movements: ____________ Start time: ____________ Finish time: ____________ Date: ____________ Movements: ____________ Start time: ____________ Finish time: ____________ Date: ____________ Movements: ____________ Start time: ____________ Finish time: ____________ Date: ____________ Movements: ____________ Start time: ____________ Finish time: ____________ Date: ____________ Movements: ____________ Start time: ____________ Finish time: ____________ Date: ____________ Movements: ____________ Start time: ____________ Finish time: ____________  Date: ____________ Movements: ____________ Start time: ____________ Finish time: ____________ Date: ____________ Movements: ____________ Start time: ____________ Finish time: ____________ Date: ____________ Movements: ____________ Start time: ____________ Finish time:  ____________ Date: ____________ Movements: ____________ Start time: ____________ Finish time: ____________ Date: ____________ Movements: ____________ Start time: ____________ Finish time: ____________ Date: ____________ Movements: ____________ Start time: ____________ Finish time: ____________ Document Released: 10/01/2006 Document Revised: 08/21/2011 Document Reviewed: 04/03/2009 ExitCare Patient Information 2012 [employer]. 

## 2012-02-06 NOTE — Progress Notes (Signed)
[redacted]w[redacted]d Doing well CBE, BF  Emotional today, support given Has FMLA papers Traveling to [facility] next weekend, precautions given Getting Doula, plans WB rv'd PTLd sx's and  FKC

## 2012-02-06 NOTE — Progress Notes (Signed)
Pt crying today received bad news regarding a childhood friends

## 2012-02-16 ENCOUNTER — Ambulatory Visit (INDEPENDENT_AMBULATORY_CARE_PROVIDER_SITE_OTHER): Payer: 59 | Admitting: Obstetrics and Gynecology

## 2012-02-16 ENCOUNTER — Telehealth: Payer: Self-pay | Admitting: Obstetrics and Gynecology

## 2012-02-16 ENCOUNTER — Encounter: Payer: Self-pay | Admitting: Obstetrics and Gynecology

## 2012-02-16 VITALS — BP 110/60 | Temp 98.5°F | Wt 179.0 lb

## 2012-02-16 DIAGNOSIS — O26899 Other specified pregnancy related conditions, unspecified trimester: Secondary | ICD-10-CM

## 2012-02-16 DIAGNOSIS — N949 Unspecified condition associated with female genital organs and menstrual cycle: Secondary | ICD-10-CM

## 2012-02-16 LAB — POCT URINALYSIS DIPSTICK
Bilirubin, UA: NEGATIVE
Glucose, UA: NEGATIVE
Ketones, UA: NEGATIVE
Spec Grav, UA: 1.01
Urobilinogen, UA: NEGATIVE

## 2012-02-16 LAB — POCT WET PREP (WET MOUNT): Trichomonas Wet Prep HPF POC: NEGATIVE

## 2012-02-16 NOTE — Progress Notes (Signed)
Increased ctx & pelvic pain since this am

## 2012-02-16 NOTE — Telephone Encounter (Signed)
TC from pt.  States has increased pelvic pressure. Had 6 cont (not painful) in 1 1/4 hours.  Has had increased vaginal discharge since 02/15/12 which she describes as watery but mucousy.  +FM  +nausea.  Cnsult with [facility].  Scheduled for eval w/VL today.

## 2012-02-16 NOTE — Progress Notes (Signed)
Addended by: Janeece Agee on: 02/16/2012 04:31 PM   Modules accepted: Orders

## 2012-02-16 NOTE — Telephone Encounter (Signed)
Pt . Addressed by nancy/pt has appt today

## 2012-02-16 NOTE — Progress Notes (Signed)
Here for occasional BHs and pelvic pressure--traveled to [facility] this weekend.  More pedal edema. Some increased d/c. FFN, GBS, GC, chlamydia done. Wet prep negative. Fern/nitrazene negative, no pooling. PTL precautions reviewed. Keep scheduled visit later this week, increase rest this week.

## 2012-02-17 LAB — GC/CHLAMYDIA PROBE AMP, GENITAL: Chlamydia, DNA Probe: NEGATIVE

## 2012-02-18 LAB — STREP B DNA PROBE: GBSP: NEGATIVE

## 2012-02-19 ENCOUNTER — Ambulatory Visit (INDEPENDENT_AMBULATORY_CARE_PROVIDER_SITE_OTHER): Payer: 59 | Admitting: Obstetrics and Gynecology

## 2012-02-19 ENCOUNTER — Encounter: Payer: Self-pay | Admitting: Obstetrics and Gynecology

## 2012-02-19 VITALS — BP 108/64 | Wt 176.0 lb

## 2012-02-19 DIAGNOSIS — Z3490 Encounter for supervision of normal pregnancy, unspecified, unspecified trimester: Secondary | ICD-10-CM

## 2012-02-19 DIAGNOSIS — Z331 Pregnant state, incidental: Secondary | ICD-10-CM

## 2012-02-19 NOTE — Progress Notes (Signed)
Pt states she has no concerns today. She is doing great!

## 2012-02-19 NOTE — Progress Notes (Signed)
No complaints.  Ctxs better.  Pt is drinking more water. GBS/GC/CT/FFN all neg 02/16/12 FKCs and PTL precautions RTO 2wks

## 2012-03-01 ENCOUNTER — Ambulatory Visit (INDEPENDENT_AMBULATORY_CARE_PROVIDER_SITE_OTHER): Payer: 59 | Admitting: Obstetrics and Gynecology

## 2012-03-01 ENCOUNTER — Encounter: Payer: Self-pay | Admitting: Obstetrics and Gynecology

## 2012-03-01 VITALS — BP 98/70 | Wt 176.0 lb

## 2012-03-01 DIAGNOSIS — Z331 Pregnant state, incidental: Secondary | ICD-10-CM

## 2012-03-01 DIAGNOSIS — Z3490 Encounter for supervision of normal pregnancy, unspecified, unspecified trimester: Secondary | ICD-10-CM

## 2012-03-01 NOTE — Progress Notes (Signed)
Doing well, no increase in contractions--not painful. GBS NV. Reviewed low back pain, comfort measures reviewed.

## 2012-03-01 NOTE — Progress Notes (Signed)
Pt still complains of irritability w/ Braxton Hix

## 2012-03-10 ENCOUNTER — Ambulatory Visit (INDEPENDENT_AMBULATORY_CARE_PROVIDER_SITE_OTHER): Payer: Self-pay | Admitting: Pediatrics

## 2012-03-10 DIAGNOSIS — Z7681 Expectant parent(s) prebirth pediatrician visit: Secondary | ICD-10-CM

## 2012-03-10 NOTE — Progress Notes (Signed)
Prenatal counseling for newborn

## 2012-03-12 ENCOUNTER — Encounter: Payer: Self-pay | Admitting: Obstetrics and Gynecology

## 2012-03-15 ENCOUNTER — Ambulatory Visit (INDEPENDENT_AMBULATORY_CARE_PROVIDER_SITE_OTHER): Payer: 59 | Admitting: Obstetrics and Gynecology

## 2012-03-15 ENCOUNTER — Encounter: Payer: Self-pay | Admitting: Obstetrics and Gynecology

## 2012-03-15 VITALS — BP 108/68 | Wt 182.0 lb

## 2012-03-15 DIAGNOSIS — Z331 Pregnant state, incidental: Secondary | ICD-10-CM

## 2012-03-15 NOTE — Progress Notes (Signed)
Pt without complaint GBS today Desires cervix check

## 2012-03-15 NOTE — Progress Notes (Signed)
Beta strep today. Return office in 2 weeks. Dr. Stefano Gaul

## 2012-03-22 ENCOUNTER — Telehealth: Payer: Self-pay | Admitting: Obstetrics and Gynecology

## 2012-03-22 ENCOUNTER — Ambulatory Visit (INDEPENDENT_AMBULATORY_CARE_PROVIDER_SITE_OTHER): Payer: 59 | Admitting: Obstetrics and Gynecology

## 2012-03-22 ENCOUNTER — Encounter: Payer: Self-pay | Admitting: Obstetrics and Gynecology

## 2012-03-22 VITALS — BP 118/68 | Wt 183.0 lb

## 2012-03-22 DIAGNOSIS — Z3490 Encounter for supervision of normal pregnancy, unspecified, unspecified trimester: Secondary | ICD-10-CM

## 2012-03-22 DIAGNOSIS — Z331 Pregnant state, incidental: Secondary | ICD-10-CM

## 2012-03-22 NOTE — Progress Notes (Signed)
No complaints  GA: [redacted]w[redacted]d  GBS: Neg ROB x1 week

## 2012-03-22 NOTE — Progress Notes (Signed)
C/o contractions every 10-15 mins Lost mucous plug on Friday

## 2012-03-22 NOTE — Telephone Encounter (Signed)
Pt is [redacted] week gestation, called complaining of increased Braxton Hicks contractions every 8 min within the last few hrs that are becoming painful. Pt also stated that on this past Friday she feels as though she may have lost her mucous plug. Pt stated that she had clear mucous glob like discharge. Pt was given ov today. Wendy Travis

## 2012-03-26 ENCOUNTER — Ambulatory Visit (INDEPENDENT_AMBULATORY_CARE_PROVIDER_SITE_OTHER): Payer: 59 | Admitting: Obstetrics and Gynecology

## 2012-03-26 ENCOUNTER — Encounter: Payer: Self-pay | Admitting: Obstetrics and Gynecology

## 2012-03-26 VITALS — BP 120/68 | Wt 182.0 lb

## 2012-03-26 DIAGNOSIS — Z331 Pregnant state, incidental: Secondary | ICD-10-CM

## 2012-03-26 DIAGNOSIS — Z3490 Encounter for supervision of normal pregnancy, unspecified, unspecified trimester: Secondary | ICD-10-CM

## 2012-03-26 NOTE — Progress Notes (Signed)
Agrees to cx check today

## 2012-03-26 NOTE — Progress Notes (Signed)
Doing well, but ready. Cervix 1 cm, 70%, vtx, -1, firm. Patient's female partner needs to travel for business over 1 of the next 3 weekends ([place] this weekend, [place] if next weekend...). Advised all I could not predict, although I would anticipate labor to be less likely this weekend than later.

## 2012-04-02 ENCOUNTER — Ambulatory Visit (INDEPENDENT_AMBULATORY_CARE_PROVIDER_SITE_OTHER): Payer: 59 | Admitting: Obstetrics and Gynecology

## 2012-04-02 VITALS — BP 110/70 | Wt 186.0 lb

## 2012-04-02 DIAGNOSIS — O368190 Decreased fetal movements, unspecified trimester, not applicable or unspecified: Secondary | ICD-10-CM

## 2012-04-02 DIAGNOSIS — Z331 Pregnant state, incidental: Secondary | ICD-10-CM

## 2012-04-02 DIAGNOSIS — Z3490 Encounter for supervision of normal pregnancy, unspecified, unspecified trimester: Secondary | ICD-10-CM

## 2012-04-02 NOTE — Addendum Note (Signed)
Addended by: Janeece Agee on: 04/02/2012 04:34 PM   Modules accepted: Orders

## 2012-04-02 NOTE — Progress Notes (Signed)
C/o braxton hicks increasing in intensity  Pt not feeling well today c/o upper gastric burning  cx check today

## 2012-04-02 NOTE — Progress Notes (Signed)
[redacted]w[redacted]d C/o upset stomach today since last night. Had not taken Zantac today as normal and attributes the upset to gastric acid accumulation. Braxton Hicks contracions increasing. No signs of labor. Had reported decreased fetal movement in last 24 hrs and noted that FHTs lower than other visits RUE:[id number] accels  x2 in followed by sleep cycle, CTX 1: 3 mins Patient informed of result ROB x 1 week if not in labor

## 2012-04-05 ENCOUNTER — Ambulatory Visit (INDEPENDENT_AMBULATORY_CARE_PROVIDER_SITE_OTHER): Payer: 59 | Admitting: Obstetrics and Gynecology

## 2012-04-05 ENCOUNTER — Encounter: Payer: Self-pay | Admitting: Obstetrics and Gynecology

## 2012-04-05 ENCOUNTER — Telehealth: Payer: Self-pay | Admitting: Obstetrics and Gynecology

## 2012-04-05 VITALS — BP 102/64 | Wt 187.0 lb

## 2012-04-05 DIAGNOSIS — Z331 Pregnant state, incidental: Secondary | ICD-10-CM

## 2012-04-05 DIAGNOSIS — Z3490 Encounter for supervision of normal pregnancy, unspecified, unspecified trimester: Secondary | ICD-10-CM

## 2012-04-05 NOTE — Telephone Encounter (Signed)
Spoke with pt

## 2012-04-05 NOTE — Progress Notes (Signed)
Pt c/o leaking of fluid.

## 2012-04-05 NOTE — Telephone Encounter (Signed)
Pt called, states this am took the dog for a walk and afterward noticed a wet spot in underwear but has gotten better, is unsure if she is leaking fluid, has been loosing mucus plug for past 2 weeks, advised pt to do "pad test" for next hour.  Pt states is @ work right now, but lives close to job and will do it on her lunch break.  Pt advised if before then feels a gush or trickle of fluid to call for further instructions.

## 2012-04-05 NOTE — Telephone Encounter (Signed)
Pt rtnd call, states did pad test and states is still noticing some dampness.  Pt w/i today @ 1515 w/ DD for eval.

## 2012-04-05 NOTE — Telephone Encounter (Signed)
Triage/epic 

## 2012-04-05 NOTE — Progress Notes (Signed)
[redacted]w[redacted]d Had called Triage earlier today c/o ? SROM  Had pad on for past 3 hrs and still remains wet ++ PV Evaluation of SROM Sterile Speculum Examination: No fluid seen in the posterior fornix. Ferning: Neg Membranes Stripped  SVE: 2 cms/80%/-1 soft and favorable. F/u Wednesday 04/07/12 if not in labor

## 2012-04-06 ENCOUNTER — Telehealth: Payer: Self-pay | Admitting: Obstetrics and Gynecology

## 2012-04-06 NOTE — Telephone Encounter (Signed)
Pt called, states had membranes swept yesterday, today has had chunks of mucus w/ brown steaks in the mucus, had some BH ctxs, but is able to work thru the ctxs, pt advised is normal to see streaks of blood in the mucus, pt to continue monitoring, if has any bright red bldg or concerns regarding baby to call back, pt has appt tomorrow.

## 2012-04-07 ENCOUNTER — Ambulatory Visit (INDEPENDENT_AMBULATORY_CARE_PROVIDER_SITE_OTHER): Payer: 59 | Admitting: Obstetrics and Gynecology

## 2012-04-07 ENCOUNTER — Encounter: Payer: Self-pay | Admitting: Obstetrics and Gynecology

## 2012-04-07 VITALS — BP 100/80 | Wt 190.0 lb

## 2012-04-07 DIAGNOSIS — Z331 Pregnant state, incidental: Secondary | ICD-10-CM

## 2012-04-07 DIAGNOSIS — Z3490 Encounter for supervision of normal pregnancy, unspecified, unspecified trimester: Secondary | ICD-10-CM

## 2012-04-07 NOTE — Progress Notes (Signed)
Pt c/o intense contractions today +FM and no leaking fluids

## 2012-04-07 NOTE — Progress Notes (Signed)
[redacted]w[redacted]d C/o severe contractions over night. ROB for evaluation of CTX - had Membranes stripped on Monday as Cx very favorable. SVE: Cx 2.5 /80%/-1 soft and favorable - membranes stripped. C/o change in vaginal secretion - more mucoid now, no blood. Prodromal labor. Advised to make ROB for early next week.

## 2012-04-13 ENCOUNTER — Ambulatory Visit (INDEPENDENT_AMBULATORY_CARE_PROVIDER_SITE_OTHER): Payer: 59 | Admitting: Obstetrics and Gynecology

## 2012-04-13 ENCOUNTER — Encounter: Payer: Self-pay | Admitting: Obstetrics and Gynecology

## 2012-04-13 VITALS — BP 100/60 | Wt 190.0 lb

## 2012-04-13 DIAGNOSIS — Z331 Pregnant state, incidental: Secondary | ICD-10-CM

## 2012-04-13 NOTE — Progress Notes (Signed)
No complaints. cx check.

## 2012-04-14 ENCOUNTER — Encounter (HOSPITAL_COMMUNITY): Payer: Self-pay | Admitting: *Deleted

## 2012-04-14 ENCOUNTER — Inpatient Hospital Stay (HOSPITAL_COMMUNITY)
Admission: AD | Admit: 2012-04-14 | Discharge: 2012-04-16 | DRG: 775 | Disposition: A | Discharge status: Home / Self Care | Payer: 59 | Source: Ambulatory Visit | Attending: Obstetrics and Gynecology | Admitting: Obstetrics and Gynecology

## 2012-04-14 DIAGNOSIS — O878 Other venous complications in the puerperium: Secondary | ICD-10-CM | POA: Diagnosis present

## 2012-04-14 DIAGNOSIS — K649 Unspecified hemorrhoids: Secondary | ICD-10-CM | POA: Diagnosis present

## 2012-04-14 DIAGNOSIS — D259 Leiomyoma of uterus, unspecified: Secondary | ICD-10-CM

## 2012-04-14 DIAGNOSIS — Z8659 Personal history of other mental and behavioral disorders: Secondary | ICD-10-CM

## 2012-04-14 DIAGNOSIS — IMO0002 Reserved for concepts with insufficient information to code with codable children: Secondary | ICD-10-CM | POA: Diagnosis present

## 2012-04-14 DIAGNOSIS — O368190 Decreased fetal movements, unspecified trimester, not applicable or unspecified: Secondary | ICD-10-CM

## 2012-04-14 LAB — RPR: RPR Ser Ql: NONREACTIVE

## 2012-04-14 LAB — CBC
MCHC: 33.3 g/dL (ref 30.0–36.0)
Platelets: 172 10*3/uL (ref 150–400)
RDW: 13.4 % (ref 11.5–15.5)

## 2012-04-14 LAB — ABO/RH: ABO/RH(D): O POS

## 2012-04-14 MED ORDER — LIDOCAINE HCL (PF) 1 % IJ SOLN
30.0000 mL | INTRAMUSCULAR | Status: DC | PRN
  Filled 2012-04-14: qty 30

## 2012-04-14 MED ORDER — ZOLPIDEM TARTRATE 5 MG PO TABS: 5.0000 mg | ORAL_TABLET | Freq: Every evening | ORAL | Status: DC | PRN

## 2012-04-14 MED ORDER — ONDANSETRON HCL 4 MG/2ML IJ SOLN: 4.0000 mg | Freq: Four times a day (QID) | INTRAMUSCULAR | Status: DC | PRN

## 2012-04-14 MED ORDER — SENNOSIDES-DOCUSATE SODIUM 8.6-50 MG PO TABS
2.0000 | ORAL_TABLET | Freq: Every day | ORAL | Status: DC
  Administered 2012-04-14 – 2012-04-15 (×2): 2 via ORAL

## 2012-04-14 MED ORDER — FLEET ENEMA 7-19 GM/118ML RE ENEM: 1.0000 | ENEMA | RECTAL | Status: DC | PRN

## 2012-04-14 MED ORDER — PRENATAL MULTIVITAMIN CH
1.0000 | ORAL_TABLET | Freq: Every day | ORAL | Status: DC
  Administered 2012-04-14 – 2012-04-16 (×3): 1 via ORAL
  Filled 2012-04-14 (×3): qty 1

## 2012-04-14 MED ORDER — ONDANSETRON HCL 4 MG PO TABS: 4.0000 mg | ORAL_TABLET | ORAL | Status: DC | PRN

## 2012-04-14 MED ORDER — IBUPROFEN 600 MG PO TABS
600.0000 mg | ORAL_TABLET | Freq: Four times a day (QID) | ORAL | Status: DC | PRN
  Administered 2012-04-14: 600 mg via ORAL
  Filled 2012-04-14: qty 1

## 2012-04-14 MED ORDER — SIMETHICONE 80 MG PO CHEW: 80.0000 mg | CHEWABLE_TABLET | ORAL | Status: DC | PRN

## 2012-04-14 MED ORDER — CITRIC ACID-SODIUM CITRATE 334-500 MG/5ML PO SOLN: 30.0000 mL | ORAL | Status: DC | PRN

## 2012-04-14 MED ORDER — BENZOCAINE-MENTHOL 20-0.5 % EX AERO
1.0000 "application " | INHALATION_SPRAY | CUTANEOUS | Status: DC | PRN
  Administered 2012-04-14: 1 via TOPICAL
  Filled 2012-04-14: qty 56

## 2012-04-14 MED ORDER — OXYCODONE-ACETAMINOPHEN 5-325 MG PO TABS
1.0000 | ORAL_TABLET | ORAL | Status: DC | PRN
  Filled 2012-04-14: qty 1

## 2012-04-14 MED ORDER — HYDROCORTISONE ACE-PRAMOXINE 1-1 % RE FOAM
1.0000 | Freq: Two times a day (BID) | RECTAL | Status: DC
  Administered 2012-04-14 – 2012-04-16 (×2): 1 via RECTAL
  Filled 2012-04-14 (×2): qty 10

## 2012-04-14 MED ORDER — WITCH HAZEL-GLYCERIN EX PADS
1.0000 "application " | MEDICATED_PAD | CUTANEOUS | Status: DC | PRN
  Administered 2012-04-15: 1 via TOPICAL

## 2012-04-14 MED ORDER — ONDANSETRON HCL 4 MG/2ML IJ SOLN: 4.0000 mg | INTRAMUSCULAR | Status: DC | PRN

## 2012-04-14 MED ORDER — DIPHENHYDRAMINE HCL 25 MG PO CAPS: 25.0000 mg | ORAL_CAPSULE | Freq: Four times a day (QID) | ORAL | Status: DC | PRN

## 2012-04-14 MED ORDER — TETANUS-DIPHTH-ACELL PERTUSSIS 5-2.5-18.5 LF-MCG/0.5 IM SUSP: 0.5000 mL | Freq: Once | INTRAMUSCULAR | Status: DC

## 2012-04-14 MED ORDER — DOCUSATE SODIUM 100 MG PO CAPS
100.0000 mg | ORAL_CAPSULE | Freq: Every day | ORAL | Status: DC
  Administered 2012-04-15 – 2012-04-16 (×2): 100 mg via ORAL
  Filled 2012-04-14 (×3): qty 1

## 2012-04-14 MED ORDER — OXYCODONE-ACETAMINOPHEN 5-325 MG PO TABS: 1.0000 | ORAL_TABLET | ORAL | Status: DC | PRN

## 2012-04-14 MED ORDER — LANOLIN HYDROUS EX OINT: TOPICAL_OINTMENT | CUTANEOUS | Status: DC | PRN

## 2012-04-14 MED ORDER — IBUPROFEN 600 MG PO TABS
600.0000 mg | ORAL_TABLET | Freq: Four times a day (QID) | ORAL | Status: DC
  Administered 2012-04-14 – 2012-04-16 (×7): 600 mg via ORAL
  Filled 2012-04-14 (×8): qty 1

## 2012-04-14 MED ORDER — DIBUCAINE 1 % RE OINT
1.0000 "application " | TOPICAL_OINTMENT | RECTAL | Status: DC | PRN
  Administered 2012-04-15: 1 via RECTAL
  Filled 2012-04-14: qty 28

## 2012-04-14 MED ORDER — ACETAMINOPHEN 325 MG PO TABS: 650.0000 mg | ORAL_TABLET | ORAL | Status: DC | PRN

## 2012-04-14 NOTE — Progress Notes (Signed)
1035- spontaneous del in tub of viable female infant. apgars 8/9. nucal cord and body cord.

## 2012-04-14 NOTE — Progress Notes (Signed)
Water temp-99.0 per vicki latham cnm.

## 2012-04-14 NOTE — Progress Notes (Signed)
0846- tub temp 99.9 per vicki latham cnm

## 2012-04-14 NOTE — Progress Notes (Signed)
Water temp 98.9 at 0938,more warm water added to tub.

## 2012-04-14 NOTE — H&P (Signed)
Wendy Travis is a 36 y.o. female presenting with onset of strong contractions around 5am.  Denies leaking, has small amount bloody show, reports +FM.  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  EDC of 04/10/12 was established by LMP and 1st trimester screen..  Anatomy scan was done at 20 weeks, with normal findings.  Her prenatal course was essentially uncomplicated. She attended WB class on 12/31/11.   History  Hx previous SAB 2011 in 1st trimester  Past Medical History  Diagnosis Date  . Seasonal allergies   . Anxiety   . Depression   . Fibroids   . Herpes     x 10 years  . Infertility, female   . Urinary tract infection   . H/O varicella     As a child   Past Surgical History  Procedure Date  . Wisdom tooth extraction    Family History: family history includes Alcohol abuse in her maternal grandfather; COPD in her maternal grandfather and maternal grandmother; Cancer in her father and maternal aunt; Diabetes in her maternal grandmother, maternal uncle, and paternal aunt; Heart disease in her maternal grandfather and maternal grandmother; Hypertension in her maternal grandfather, maternal grandmother, mother, paternal grandfather, and paternal grandmother; Kidney disease in her maternal grandmother; and Stroke in her paternal grandfather and paternal grandmother.  Social History:  reports that she has never smoked. She has never used smokeless tobacco. She reports that she does not drink alcohol or use illicit drugs.  FOB is present with patient, in addition to her additional female partner.  She also has a doula with her today.  Patient is employed, Caucasian.  ROS:  Conttractions, bloody show, +FM.  VSS, afebrile  Exam Physical Exam    Chest clear Heart RRR without murmur Abd gravid, NT Pelvic--7 cm, 50%, vtx, -1, IBOW  FHR reassuring per auscultation, no audible decels UCs q 3 min, strong.  Prenatal labs: ABO, Rh: --/--/O POS (07/31 1130) Antibody: Negative (03/05 0000) Rubella:  Immune RPR: NON REAC (04/23 0350)  HBsAg: Negative (03/05 0000)  HIV: Non-reactive (03/05 0000)  GBS: NEGATIVE (07/01 1651)  Normal 1st trimester screen and AFP Normal glucola Hgb WNL at NOB and at 28 weeks  Assessment/Plan: IUP at 40 4/7 weeks Active labor GBS negative Desires waterbirth  Plan: Admit to [facility] per consult with Dr. Stefano Gaul Routine [facility] orders OK for waterbirth tub use and delivery in tub prn. IV deferred per patient request.   Nigel Bridgeman 04/14/2012, 1:18 PM

## 2012-04-14 NOTE — Progress Notes (Signed)
1914- water temp 99.

## 2012-04-14 NOTE — Progress Notes (Addendum)
  Subjective: Current in tub, labor support from doula and partners. Some rectal pressure.  Objective: BP 106/57  Pulse 82  Temp 97.9 F (36.6 C) (Oral)  Resp 20  Ht 5\' 5"  (1.651 m)  Wt 190 lb (86.183 kg)  BMI 31.62 kg/m2  LMP 07/05/2011      FHT: Reassuring by intermittent auscultation UC:   regular, every 3 minutes  Assessment / Plan: Continue labor observation. Re-evaluate prn.  Nigel Bridgeman 04/14/2012, 9:48 AM

## 2012-04-14 NOTE — MAU Note (Signed)
Patient states she is having contractions every 2-3 minutes with bloody show. Reports no leaking and good fetal movement.

## 2012-04-15 LAB — CBC
Platelets: 140 10*3/uL — ABNORMAL LOW (ref 150–400)
RDW: 13.8 % (ref 11.5–15.5)
WBC: 12.3 10*3/uL — ABNORMAL HIGH (ref 4.0–10.5)

## 2012-04-15 NOTE — Progress Notes (Signed)
S: comfortable, little bleeding, slept little     breastfeeding O VSS     abd soft, nt, ff      sm  Flow perineum clean intact     -Homans sign bilaterally,      +1 edema A normal involution     Lactating     PP day 1 P mirena pp, continue care Lavera Guise, CNM

## 2012-04-16 ENCOUNTER — Other Ambulatory Visit: Payer: 59

## 2012-04-16 MED ORDER — HYDROCORTISONE ACETATE 25 MG RE SUPP: 25.0000 mg | Freq: Two times a day (BID) | RECTAL | Status: AC

## 2012-04-16 MED ORDER — IBUPROFEN 600 MG PO TABS: 600.0000 mg | ORAL_TABLET | Freq: Four times a day (QID) | ORAL | Status: AC | PRN

## 2012-04-16 NOTE — Progress Notes (Signed)
Patient was referred for history of depression/anxiety. * Referral screened out by Clinical Social Worker because none of the following criteria appear to apply:  ~ History of anxiety/depression during this pregnancy, or of post-partum depression.  ~ Diagnosis of anxiety and/or depression within last 3 years  ~ History of depression due to pregnancy loss/loss of child  OR * Patient's symptoms currently being treated with medication and/or therapy.  Please contact the Clinical Social Worker if needs arise, or by the patient's request. No issues in 7 years, as per pt.

## 2012-04-16 NOTE — Discharge Summary (Signed)
Physician Discharge Summary  Patient ID: Shunta Mclaurin MRN: [id number] DOB/AGE: 1976/08/12 36 y.o.  Admit date: 04/14/2012 Discharge date: 04/16/2012  Admission Diagnoses: 40w active labor  Discharge Diagnoses:  Principal Problem:  *Vaginal delivery Active Problems:  Perineal laceration with delivery, second degree  Hemorrhoids complicating pregnancy or puerperium   Discharged Condition: stable  Hospital Course: SVD, normal involution, Contraception - Mirena  Consults: None  Significant Diagnostic Studies: labs:  Hemoglobin & Hematocrit     Component Value Date/Time   HGB 10.7* 04/15/2012 0530   HCT 31.7* 04/15/2012 0530      Treatments: None.  Discharge Exam: Blood pressure 109/73, pulse 76, temperature 98 F (36.7 C), temperature source Oral, resp. rate 18, height 5\' 5"  (1.651 m), weight 190 lb (86.183 kg), last menstrual period 07/05/2011, SpO2 96.00%, unknown if currently breastfeeding. General appearance: alert, cooperative and no distress S: comfortable, little bleeding, slept little     breastfeeding O VSS     abd soft, nt, ff      sm  Flow perineum clean intact 2 MLL well approximated with eccymosis, no edema, cluster pink hemorrhoids     -Homans sign bilaterally,      +2 edema lower legs  Disposition: 01-Home or Self Care  Discharge Orders    Future Orders Please Complete By Expires   OB RESULTS CONSOLE RPR      Comments:   This external order was created through the Results Console.   OB RESULTS CONSOLE HIV antibody      Comments:   This external order was created through the Results Console.   OB RESULTS CONSOLE Hepatitis B surface antigen      Comments:   This external order was created through the Results Console.   OB RESULTS CONSOLE ABO/Rh      Comments:   This external order was created through the Results Console.   OB RESULTS CONSOLE Antibody Screen      Comments:   This external order was created through the Results Console.      Medication List  As of 04/16/2012  8:49 AM   STOP taking these medications         TUMS PO         TAKE these medications         docusate sodium 100 MG capsule   Commonly known as: COLACE   Take 100 mg by mouth 2 (two) times daily as needed. For constipation      FIBER SELECT GUMMIES PO   Take by mouth daily.      fish oil-omega-3 fatty acids 1000 MG capsule   Take 2 g by mouth at bedtime.      hydrocortisone 25 MG suppository   Commonly known as: ANUSOL-HC   Place 1 suppository (25 mg total) rectally 2 (two) times daily.      ibuprofen 600 MG tablet   Commonly known as: ADVIL,MOTRIN   Take 1 tablet (600 mg total) by mouth every 6 (six) hours as needed for pain.      prenatal multivitamin Tabs   Take 1 tablet by mouth at bedtime.      PROBIOTIC PO   Take 1 tablet by mouth at bedtime.      ranitidine 75 MG tablet   Commonly known as: ZANTAC   Take 75 mg by mouth 2 (two) times daily.      SLOW FE 142 (45 FE) MG Tbcr   Generic drug: Ferrous Sulfate   Take 1 tablet  by mouth at bedtime.           Follow-up Information    Follow up with [facility] in 5 weeks.       [facility] Handbook.  SignedLavera Guise 04/16/2012, 8:49 AM

## 2012-04-19 ENCOUNTER — Other Ambulatory Visit: Payer: 59

## 2012-04-19 ENCOUNTER — Other Ambulatory Visit: Payer: 59 | Admitting: Obstetrics and Gynecology

## 2012-04-29 ENCOUNTER — Telehealth: Payer: Self-pay | Admitting: Obstetrics and Gynecology

## 2012-04-29 NOTE — Telephone Encounter (Signed)
Try calling rgd msg for breast pump number not in service

## 2012-04-29 NOTE — Telephone Encounter (Signed)
Wendy Travis/FOLLOW UP

## 2012-05-10 ENCOUNTER — Telehealth: Payer: Self-pay | Admitting: Obstetrics and Gynecology

## 2012-05-11 ENCOUNTER — Encounter: Payer: Self-pay | Admitting: Obstetrics and Gynecology

## 2012-05-11 ENCOUNTER — Ambulatory Visit (INDEPENDENT_AMBULATORY_CARE_PROVIDER_SITE_OTHER): Payer: 59 | Admitting: Obstetrics and Gynecology

## 2012-05-11 VITALS — BP 98/64 | Wt 158.0 lb

## 2012-05-11 DIAGNOSIS — F489 Nonpsychotic mental disorder, unspecified: Secondary | ICD-10-CM

## 2012-05-11 DIAGNOSIS — O99345 Other mental disorders complicating the puerperium: Secondary | ICD-10-CM

## 2012-05-11 DIAGNOSIS — F329 Major depressive disorder, single episode, unspecified: Secondary | ICD-10-CM

## 2012-05-11 MED ORDER — SERTRALINE HCL 50 MG PO TABS: 50.0000 mg | ORAL_TABLET | Freq: Every day | ORAL | Status: DC

## 2012-05-11 NOTE — Progress Notes (Signed)
Pt here to discuss post partum depression. Pt delivered on 04/14/12. EPDS=16

## 2012-05-11 NOTE — Progress Notes (Signed)
States feeling of worthlessness, up with baby every 1-2 hours, good support from partners x 2 in the house, teary, OCD issues. Patient counseled regarding medication and/or counseling. Will see her counselor today, agrees to try medication. Zoloft 50 mg daily e-prescribed, will monitor for side effects. S/s depression, suicide thoughts to report reviewed; she was told to come back here or go to nearest ER for any worsening symptoms.  Will re-evaluate at next visit.

## 2012-05-13 ENCOUNTER — Telehealth: Payer: Self-pay | Admitting: Obstetrics and Gynecology

## 2012-05-13 NOTE — Telephone Encounter (Signed)
Pt called regarding msg.  States her current dosage causes her teeth to chatter, head feels as if she has a sinus headache, and drowsiness.  Pt wants to know if she can split the dosage.  Per VL, ok to break in half and take Zoloft 25 mg in am and 25 mg in pm, pt voices understanding.

## 2012-05-13 NOTE — Telephone Encounter (Signed)
TRIAGE/RX QUEST. °

## 2012-05-21 ENCOUNTER — Ambulatory Visit (INDEPENDENT_AMBULATORY_CARE_PROVIDER_SITE_OTHER): Payer: 59

## 2012-05-21 VITALS — BP 118/84 | Temp 98.4°F | Wt 160.0 lb

## 2012-05-21 DIAGNOSIS — O99345 Other mental disorders complicating the puerperium: Secondary | ICD-10-CM

## 2012-05-21 DIAGNOSIS — Z309 Encounter for contraceptive management, unspecified: Secondary | ICD-10-CM

## 2012-05-21 DIAGNOSIS — F53 Postpartum depression: Secondary | ICD-10-CM

## 2012-05-21 DIAGNOSIS — F489 Nonpsychotic mental disorder, unspecified: Secondary | ICD-10-CM

## 2012-05-21 DIAGNOSIS — F329 Major depressive disorder, single episode, unspecified: Secondary | ICD-10-CM

## 2012-05-21 NOTE — Progress Notes (Signed)
Date of delivery: 04/14/12 Female Name: Wendy Travis Vaginal delivery:yes Cesarean section:no Tubal ligation:no GDM:no Breast Feeding:yes Bottle Feeding:no Post-Partum Blues:yes Abnormal pap:no Normal GU function: yes Normal GI function:yes Returning to work:yes EPDS: 12  S:  Pt presents for f/u PPD, and now 5.5 weeks PP.  Started on Zoloft 05/11/12 by Chiquita Loth, CNM, but hasn't followed dosing b/c of side effects; reports "shaking and teeth chattering" on 50mg  dose.  Pt called and was instructed to cut in half and take bid, and for last 3 days has only taken qd, and done well.  She denies any SI or HI.  Still reports "last 3 days were rough," but does feel some improvement.  Breastfeeding and Lt breast making abundant flow.  She reports good support at home from both partners, and female partner was one who noticed pt struggling.  Pt is following counselor and f/u every 2 weeks at present.  Pt reports return of menses.  She is already anticipating and anxious r/e RTW in November; infant will go to daycare.  EPPDS is less today than at last visit 12 (16).    O:  AVVS       Limited exam:  Gen:  NAD, A&Ox3, pleasant, smiles; more anxious when infant started to cry and quickly tried to console her; had baby in sling        Deferred remainder of exam  A/P:  1. 5.5 weeks pp/ Lactating       2.  Acute PPD w/ h/o OCD, anxiety, depression       3.  SE w/ 50mg  Zoloft dose, so rec'd pt continue on 25mg  po qd and RTO next week to see VL and may decide at that point if want to try bid or continue on qd, or assess if different POC needed      4.  Continue following counselor as Rx'd      5.  Seek immediate attention if SI or HI and pt verbalized agreement      6.  Desires micronor--Rx given; dosing compliance disc'd

## 2012-05-24 MED ORDER — NORETHINDRONE 0.35 MG PO TABS: 1.0000 | ORAL_TABLET | Freq: Every day | ORAL | Status: DC

## 2012-05-25 ENCOUNTER — Ambulatory Visit (INDEPENDENT_AMBULATORY_CARE_PROVIDER_SITE_OTHER): Payer: 59 | Admitting: Obstetrics and Gynecology

## 2012-05-25 ENCOUNTER — Encounter: Payer: Self-pay | Admitting: Obstetrics and Gynecology

## 2012-05-25 NOTE — Progress Notes (Signed)
Wendy Travis is a 36 y.o. female who presents for a postpartum visit.   Had waterbirth, has 2 partners (one female and one female).  All coping well with new baby.  Patient seen approx 1 week ago for adjustment of Zoloft, due to SE.  Now on 25 mg po q day, and doing well.  Much improved.  Patient reports no physical issues.  Hx remarkable for: Patient Active Problem List  Diagnosis  . hx of depression and anxiety  . Fibroid, uterine  . Vaginal delivery  . Perineal laceration with delivery, second degree  . Hemorrhoids complicating pregnancy or puerperium      PPDS = 9, but feeling much better now.  Denies SI/HI.  Contraception plan:  Micronor--hasn't started yet/   I have fully reviewed the prenatal and intrapartum course   Patient has not been sexually active since delivery.   The following portions of the patient's history were reviewed and updated as appropriate: allergies, current medications, past family history, past medical history, past social history, past surgical history and problem list.  Review of Systems Pertinent items are noted in HPI.   Objective:    BP 98/68  Resp 14  Wt 156 lb (70.761 kg)  Breastfeeding? Yes  General:  alert, cooperative and no distress     Lungs: clear to auscultation bilaterally  Heart:  regular rate and rhythm, S1, S2 normal, no murmur  Abdomen: soft, non-tender; bowel sounds normal; no masses,  no organomegaly   Vulva:  normal  Vagina: normal vagina  Cervix:  normal  Uterus: normal size, contour, position, consistency, mobility, non-tender, well-involuted  Adnexa:  normal adnexa       Hemorrhoids stable.      Assessment:     Normal postpartum exam.  Pap smear not done at today's visit. Will plan in 6 months or so.  Plan:  Follow-up at annual in early 2014 Rxs:  Continue Zoloft 25 mg po q day x at least 2 months--will call if needs refill. Start Micronor--has Rx.  Nigel Bridgeman CNM, [place] 05/25/2012 11:22 AM

## 2012-05-25 NOTE — Progress Notes (Signed)
Date of delivery: 04/14/12  Female Name: Wendy Travis Vaginal delivery:yes Cesarean section:no Tubal ligation:no GDM:no Breast Feeding:yes Bottle Feeding:no Post-Partum Blues:yes Abnormal pap:no 03/2011 Normal GU function: yes Normal GI function:yes Returning to work:yes Nov 1st EPDS: 9  *Pt wants Mini Pill.

## 2012-07-13 ENCOUNTER — Ambulatory Visit (INDEPENDENT_AMBULATORY_CARE_PROVIDER_SITE_OTHER): Payer: 59 | Admitting: Obstetrics and Gynecology

## 2012-07-13 ENCOUNTER — Encounter: Payer: Self-pay | Admitting: Obstetrics and Gynecology

## 2012-07-13 VITALS — BP 100/66 | Resp 16 | Wt 160.0 lb

## 2012-07-13 DIAGNOSIS — IMO0001 Reserved for inherently not codable concepts without codable children: Secondary | ICD-10-CM

## 2012-07-13 DIAGNOSIS — B9689 Other specified bacterial agents as the cause of diseases classified elsewhere: Secondary | ICD-10-CM

## 2012-07-13 DIAGNOSIS — A499 Bacterial infection, unspecified: Secondary | ICD-10-CM

## 2012-07-13 DIAGNOSIS — N760 Acute vaginitis: Secondary | ICD-10-CM

## 2012-07-13 DIAGNOSIS — Z309 Encounter for contraceptive management, unspecified: Secondary | ICD-10-CM

## 2012-07-13 DIAGNOSIS — N63 Unspecified lump in unspecified breast: Secondary | ICD-10-CM

## 2012-07-13 MED ORDER — METRONIDAZOLE 0.75 % VA GEL: 1.0000 | Freq: Every day | VAGINAL | Status: AC

## 2012-07-13 NOTE — Progress Notes (Signed)
Here for discussion and planning for IUD--has been using Micronor, but having difficulty keeping up with doses. S/P waterbirth 04/16/12 with VL.  Doing well, breastfeeding, great production. Reports has lump in right breast, noted over the last 1-2 weeks.  Does not diminish with feedings.  Assessment: Breasts--lactating.  Right breast has palpable lump noted at 12 o'clock under the aerola, just above the nipple.  Mass is approx 1 cm, NT, mobile.  No dimpling or skin distortion.  Pelvic--white vaginal d/c noted in vault. Cervix closed, long, NT Wet prep--BV  Assessment/Plan: Desires Mirena IUD--cultures done.  Information reviewed.  Schedule insertion. BV--Rx Metrogel q hs x 5 days. Right breast lump--[facility].

## 2012-07-13 NOTE — Progress Notes (Deleted)
Pt wants Mirena

## 2012-07-14 ENCOUNTER — Telehealth: Payer: Self-pay | Admitting: Obstetrics and Gynecology

## 2012-07-14 DIAGNOSIS — B9689 Other specified bacterial agents as the cause of diseases classified elsewhere: Secondary | ICD-10-CM | POA: Insufficient documentation

## 2012-07-14 DIAGNOSIS — N760 Acute vaginitis: Secondary | ICD-10-CM | POA: Insufficient documentation

## 2012-07-14 DIAGNOSIS — N6310 Unspecified lump in the right breast, unspecified quadrant: Secondary | ICD-10-CM

## 2012-07-14 DIAGNOSIS — N63 Unspecified lump in unspecified breast: Secondary | ICD-10-CM | POA: Insufficient documentation

## 2012-07-14 NOTE — Telephone Encounter (Signed)
TC from breast center order needs to change from digital diagnostic to breast u/s since pt is bfeeding.

## 2012-07-15 LAB — GC/CHLAMYDIA PROBE AMP: CT Probe RNA: NEGATIVE

## 2012-07-19 ENCOUNTER — Encounter: Payer: Self-pay | Admitting: Obstetrics and Gynecology

## 2012-07-19 ENCOUNTER — Ambulatory Visit
Admission: RE | Admit: 2012-07-19 | Discharge: 2012-07-19 | Disposition: A | Discharge status: Home / Self Care | Payer: 59 | Source: Ambulatory Visit | Attending: Obstetrics and Gynecology | Admitting: Obstetrics and Gynecology

## 2012-07-19 DIAGNOSIS — N6310 Unspecified lump in the right breast, unspecified quadrant: Secondary | ICD-10-CM

## 2012-07-22 ENCOUNTER — Encounter: Payer: Self-pay | Admitting: Obstetrics and Gynecology

## 2012-07-22 ENCOUNTER — Ambulatory Visit (INDEPENDENT_AMBULATORY_CARE_PROVIDER_SITE_OTHER): Payer: 59 | Admitting: Obstetrics and Gynecology

## 2012-07-22 VITALS — BP 102/60 | Wt 154.0 lb

## 2012-07-22 DIAGNOSIS — Z30430 Encounter for insertion of intrauterine contraceptive device: Secondary | ICD-10-CM

## 2012-07-22 MED ORDER — LEVONORGESTREL 20 MCG/24HR IU IUD
INTRAUTERINE_SYSTEM | Freq: Once | INTRAUTERINE | Status: AC
  Administered 2012-07-22: 1 via INTRAUTERINE

## 2012-07-22 NOTE — Patient Instructions (Addendum)
Call [facility] [phone]:  -for temperature of 100.4 degrees Fahrenheit or more -pain not improved with over the counter pain medications (Ibuprofen, Advil, Aleve,        Tylenol or acetaminophen) -for excessive bleeding (more than a usual period) -for any other concerns  Do not place anything in your vagina for the next 7 days    

## 2012-07-22 NOTE — Progress Notes (Signed)
IUD INSERTION NOTE  Wendy Travis is a 36 y.o. female G2P1011 who presents for IUD insertion.  Consent signed after risks and benefits were reviewed including but not limited to bleeding, infection, expulsion and risk of uterine perforation that may require an additional procedure for removal.  LMP: No LMP recorded. Patient is not currently having periods (Reason: Lactating). UPT: negative   Uterus assessed for size and position Prepped with Betadine/Hibiclens  Tenaculum placed on anterior lip of cervix after Hurricane gel was applied Uterus sounded at  7  cm Insertion of MIRENA IUD per protocol without any complications Strings trimmed   Assessment:  IUD Insertion  Plan:  1. Patient instructed to call with oral temperature of 100.4 degrees Fahrenheit or more, excessive bleeding or pain that is not relieved with OTC analgesia taken as directed  2. Patient instructed on how  to check IUD strings and encouraged to do so after each menstrual cycle  3. Advised not to place anything in vagina or have sexual intercourse for 7 days  4. Follow-up: 4 weeks   Chinelo Benn PA-C 07/22/2012 3:36 PM

## 2012-07-22 NOTE — Addendum Note (Signed)
Addended byWinfred Leeds on: 07/22/2012 04:51 PM   Modules accepted: Orders

## 2012-08-16 ENCOUNTER — Encounter: Payer: Self-pay | Admitting: Obstetrics and Gynecology

## 2012-08-16 ENCOUNTER — Ambulatory Visit (INDEPENDENT_AMBULATORY_CARE_PROVIDER_SITE_OTHER): Payer: BC Managed Care – PPO | Admitting: Obstetrics and Gynecology

## 2012-08-16 VITALS — BP 110/70 | HR 68 | Wt 160.0 lb

## 2012-08-16 DIAGNOSIS — Z30431 Encounter for routine checking of intrauterine contraceptive device: Secondary | ICD-10-CM

## 2012-08-16 NOTE — Progress Notes (Signed)
36 YO for IUD check up (Mirena inserted 07/22/12). Denies any further bleeding or cramping and no complaints from partner regarding string.  O: Pelvic:  EGBUS-wnl, vagina-thin clear discharge with tenderness on left side (per patient, has been that way since delivery), string visible, uterus/adnexae-no tenderness or masses  UPT-negative  A:  IUD Check Up  P:  RTO: as scheduled or prn  Kip Cropp, PA-C

## 2013-02-12 IMAGING — US US ABDOMEN COMPLETE
1 series · 14 of 25 positions shown · non-contrast
Comparison: None.

CLINICAL DATA: Right upper quadrant pain

COMPLETE ABDOMINAL ULTRASOUND

[Series 1: us abdomen complete · 0.20mm/px · 14 of 89 slices shown]
[im 1/89]
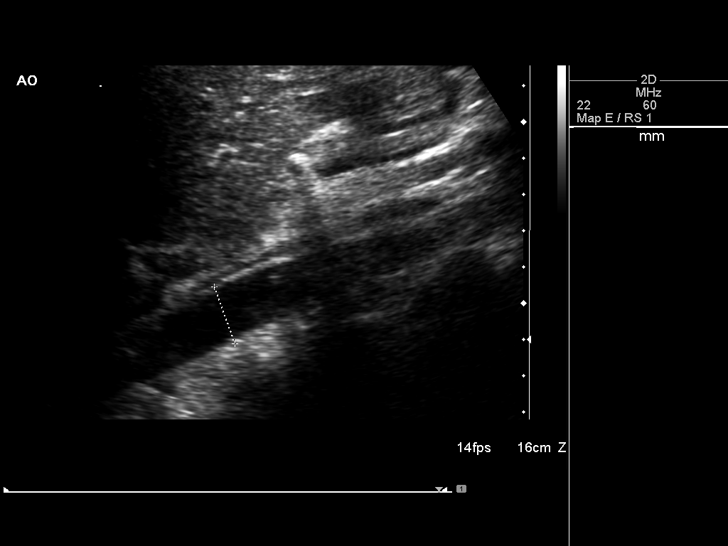
[im 8/89]
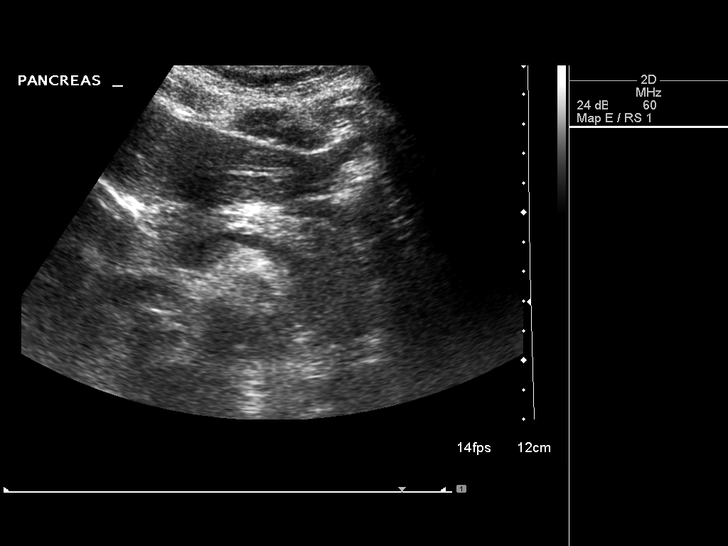
[im 15/89]
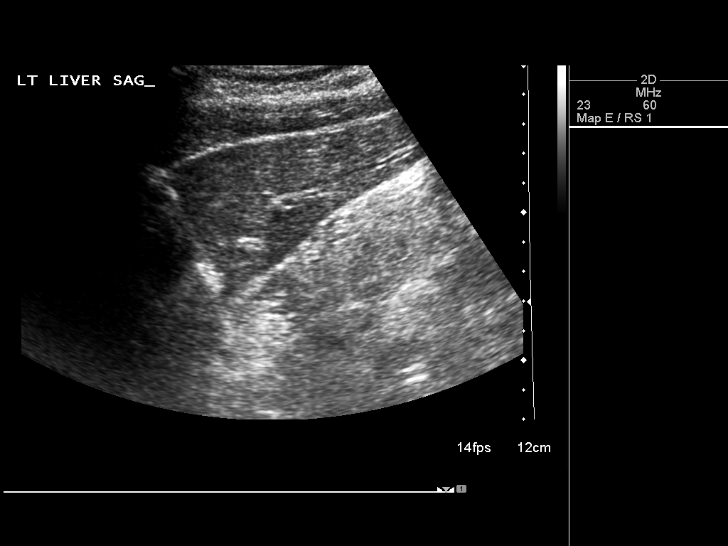
[im 23/89]
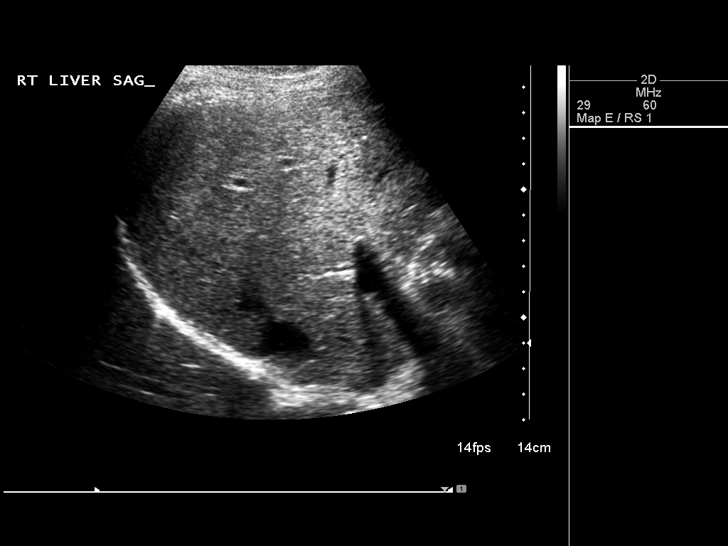
[im 30/89]
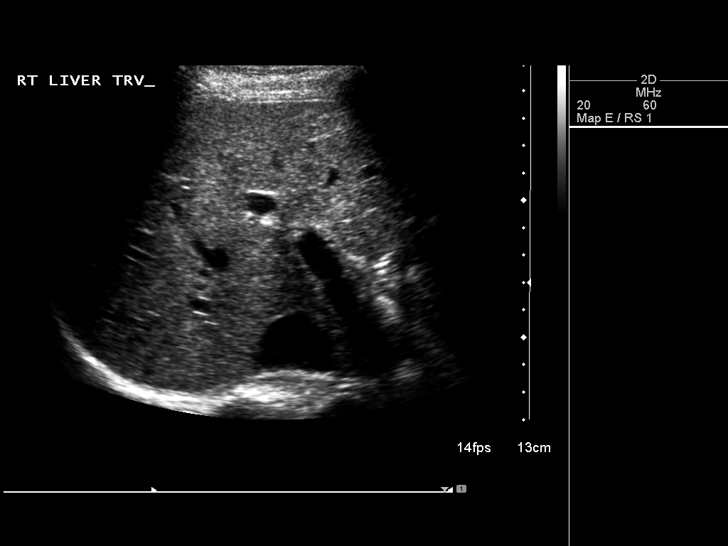
[im 34/89]
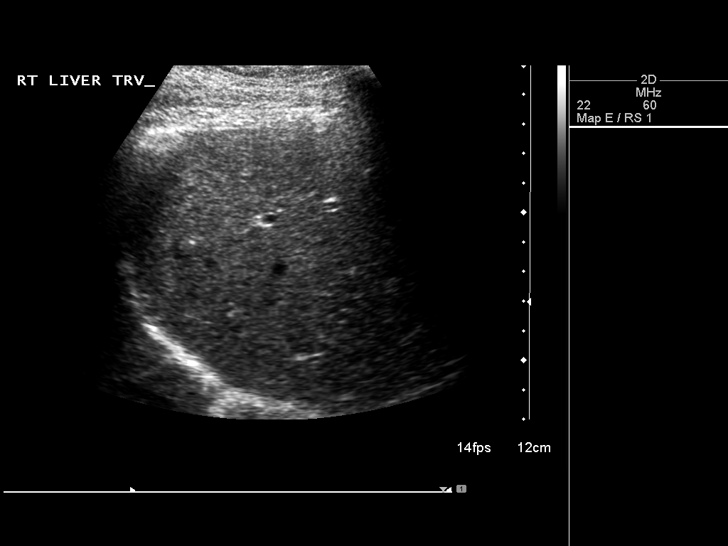
[im 41/89]
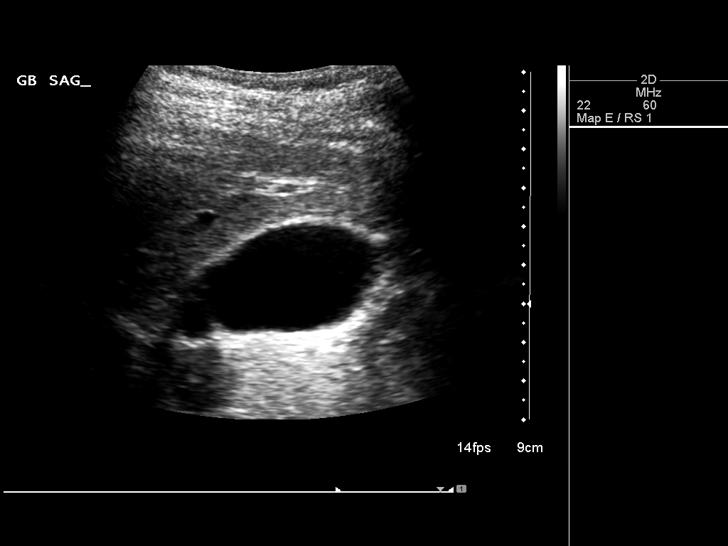
[im 48/89]
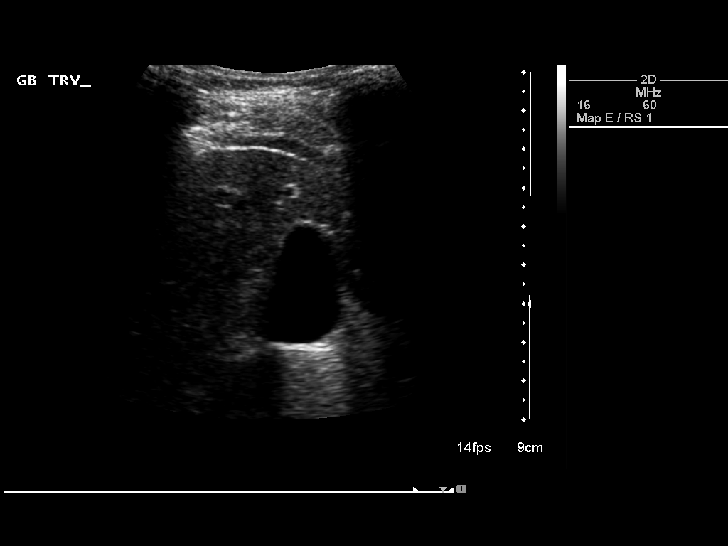
[im 56/89]
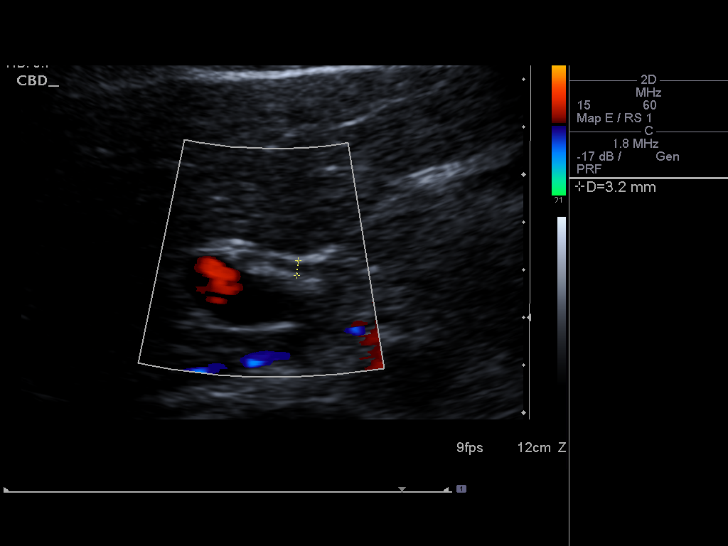
[im 59/89]
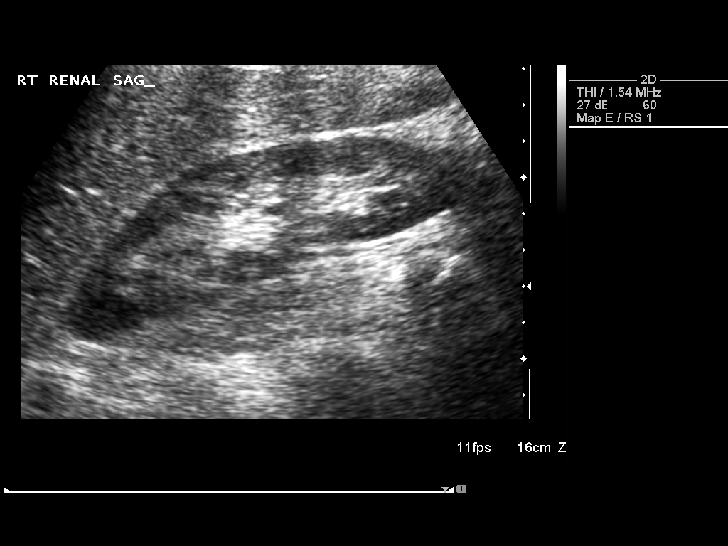
[im 67/89]
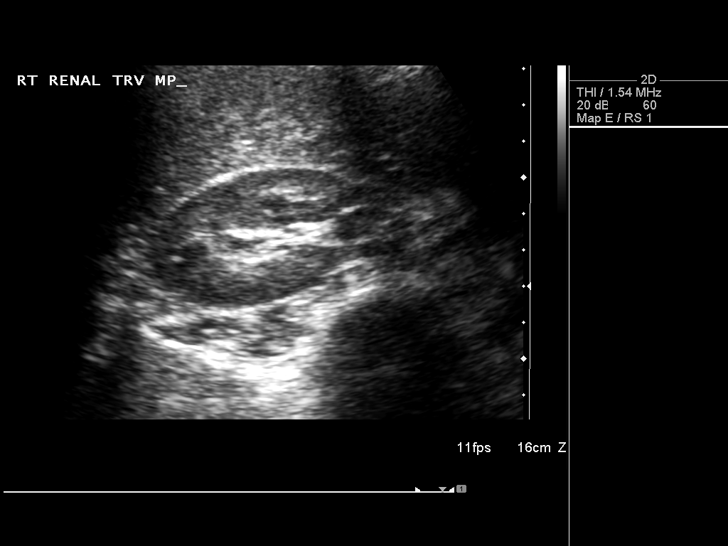
[im 74/89]
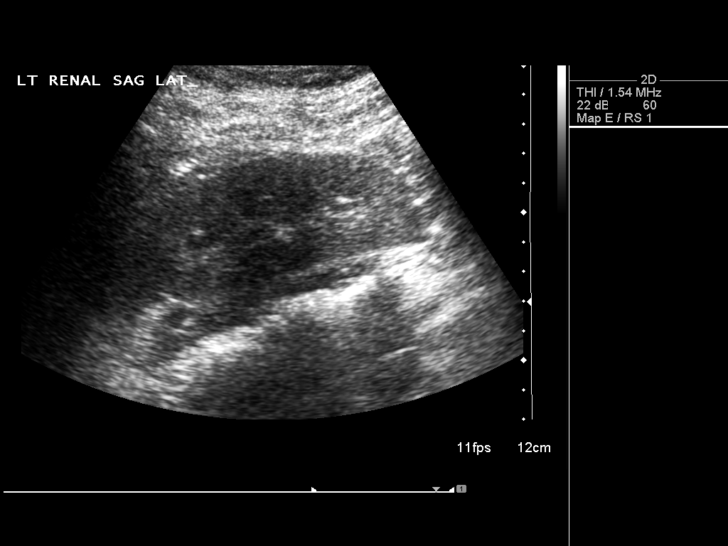
[im 81/89]
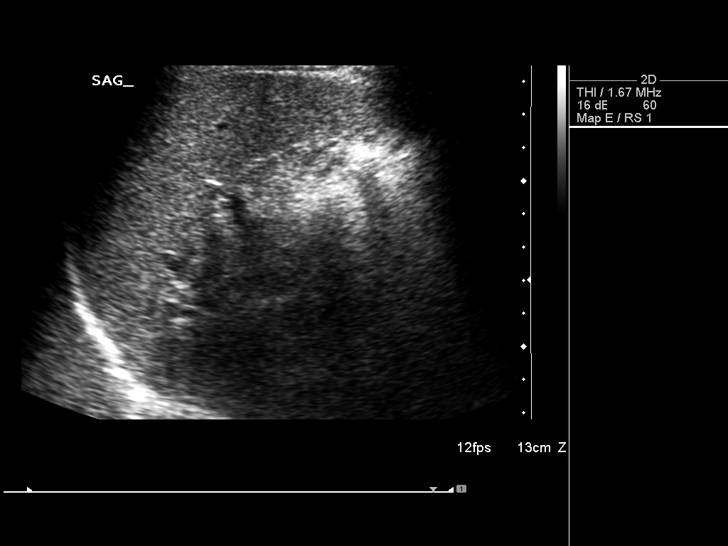
[im 89/89]
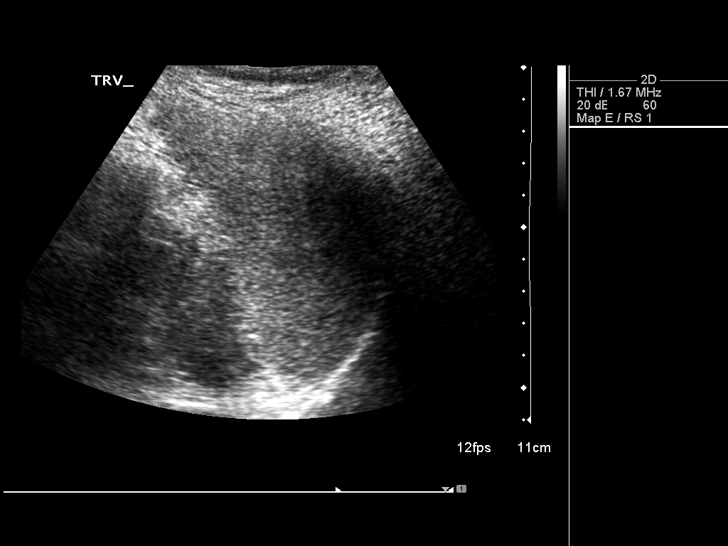

[14 of 25 positions shown; findings below may reference images not displayed]

FINDINGS: Gallbladder:  No gallstones, gallbladder wall thickening, or
pericholecystic fluid.

Common bile duct:  Normal at 3 mm

Liver:  No focal lesion identified.  Within normal limits in
parenchymal echogenicity.

IVC:  Appears normal.

Pancreas:  No focal abnormality seen.

Spleen:  Normal in size and echogenicity.  Small splenule noted.

Right Kidney:  11.8cm in length.  No evidence of hydronephrosis or
stones.

Left Kidney:  11.7cm in length.  No evidence of hydronephrosis or
stones.

Abdominal aorta:  No aneurysm identified.
IMPRESSION: Negative abdominal ultrasound.

## 2013-07-21 ENCOUNTER — Other Ambulatory Visit: Payer: Self-pay

## 2014-07-17 ENCOUNTER — Encounter: Payer: Self-pay | Admitting: Obstetrics and Gynecology

## 2016-08-26 DIAGNOSIS — Z23 Encounter for immunization: Secondary | ICD-10-CM | POA: Diagnosis not present

## 2016-09-13 DIAGNOSIS — Z0100 Encounter for examination of eyes and vision without abnormal findings: Secondary | ICD-10-CM | POA: Diagnosis not present

## 2016-11-25 DIAGNOSIS — F411 Generalized anxiety disorder: Secondary | ICD-10-CM | POA: Diagnosis not present

## 2017-03-13 MED FILL — SPIRONOLACTONE 50 MG TAB: 50 | 30 days supply | Qty: 30 | Fill #0

## 2017-04-06 MED FILL — SPIRONOLACTONE 50 MG TAB: 50 | 30 days supply | Qty: 30 | Fill #1

## 2017-05-11 MED FILL — SPIRONOLACTONE 50 MG TABLET: 50 | 30 days supply | Qty: 30 | Fill #0

## 2017-06-11 MED FILL — SPIRONOLACTONE 50 MG TAB: 50 | 30 days supply | Qty: 30 | Fill #1

## 2017-07-14 MED FILL — SPIRONOLACTONE 50 MG TAB: 50 | 30 days supply | Qty: 30 | Fill #2

## 2017-08-17 MED FILL — SPIRONOLACTONE 50 MG TAB: 50 | 30 days supply | Qty: 30 | Fill #0

## 2017-10-01 MED FILL — SPIRONOLACTONE 50 MG TAB: 50 | 30 days supply | Qty: 30 | Fill #1

## 2017-11-04 MED FILL — SPIRONOLACTONE 50 MG TABLET: 50 | 30 days supply | Qty: 30 | Fill #2

## 2017-12-09 MED FILL — SPIRONOLACTONE 25 MG TABLET: 25 | 30 days supply | Qty: 60 | Fill #0

## 2018-01-11 MED FILL — SPIRONOLACTONE 25 MG TABLET: 25 | 30 days supply | Qty: 60 | Fill #1

## 2018-02-16 MED FILL — SPIRONOLACTONE 25 MG TABLET: 25 | 30 days supply | Qty: 60 | Fill #2

## 2018-03-22 MED FILL — SPIRONOLACTONE 25 MG TABLET: 25 | 30 days supply | Qty: 60 | Fill #3

## 2018-04-30 MED FILL — SPIRONOLACTONE 50 MG TAB: 50 | 90 days supply | Qty: 90 | Fill #0

## 2018-08-09 MED FILL — SPIRONOLACTONE 50 MG TABLET: 50 | 90 days supply | Qty: 90 | Fill #1

## 2018-09-11 ENCOUNTER — Ambulatory Visit (HOSPITAL_COMMUNITY)
Admission: EM | Admit: 2018-09-11 | Discharge: 2018-09-11 | Disposition: A | Discharge status: Home / Self Care | Payer: Self-pay | Attending: Family Medicine | Admitting: Family Medicine

## 2018-09-11 ENCOUNTER — Encounter (HOSPITAL_COMMUNITY): Payer: Self-pay | Admitting: Family Medicine

## 2018-09-11 ENCOUNTER — Other Ambulatory Visit: Payer: Self-pay

## 2018-09-11 DIAGNOSIS — J020 Streptococcal pharyngitis: Secondary | ICD-10-CM | POA: Insufficient documentation

## 2018-09-11 DIAGNOSIS — J029 Acute pharyngitis, unspecified: Secondary | ICD-10-CM | POA: Insufficient documentation

## 2018-09-11 DIAGNOSIS — R509 Fever, unspecified: Secondary | ICD-10-CM | POA: Insufficient documentation

## 2018-09-11 LAB — POCT RAPID STREP A: Streptococcus, Group A Screen (Direct): POSITIVE — AB

## 2018-09-11 MED ORDER — AMOXICILLIN 875 MG PO TABS: 875.0000 mg | ORAL_TABLET | Freq: Two times a day (BID) | ORAL | 0 refills | Status: AC

## 2018-09-11 MED ORDER — ACETAMINOPHEN 325 MG PO TABS
650.0000 mg | ORAL_TABLET | Freq: Once | ORAL | Status: AC
  Administered 2018-09-11: 650 mg via ORAL

## 2018-09-11 MED ORDER — ACETAMINOPHEN 325 MG PO TABS
ORAL_TABLET | ORAL | Status: AC
Start: 2018-09-11 — End: 2018-09-11
  Filled 2018-09-11: qty 2

## 2018-09-11 NOTE — ED Triage Notes (Signed)
Pt cc fever , cough, sore throat and chills. This started today.

## 2018-09-14 NOTE — ED Provider Notes (Signed)
[facility]   [id number] 09/11/18 Arrival Time: 4098  ASSESSMENT & PLAN:  1. Fever, unspecified fever cause   2. Sore throat   3. Strep pharyngitis    No sign of peritonsillar abscess.  Meds ordered this encounter  Medications  . acetaminophen (TYLENOL) tablet 650 mg  . amoxicillin (AMOXIL) 875 MG tablet    Sig: Take 1 tablet (875 mg total) by mouth 2 (two) times daily for 10 days.    Dispense:  20 tablet    Refill:  0    Labs Reviewed  POCT RAPID STREP A - Abnormal; Notable for the following components:      Result Value   Streptococcus, Group A Screen (Direct) POSITIVE (*)    All other components within normal limits   OTC analgesics and throat care as needed  Instructed to finish full 10 day course of antibiotics. Will follow up if not showing significant improvement over the next 24-48 hours.  Reviewed expectations re: course of current medical issues. Questions answered. Outlined signs and symptoms indicating need for more acute intervention. Patient verbalized understanding. After Visit Summary given.   SUBJECTIVE:  Wendy Travis is a 42 y.o. female who reports a sore throat. Describes as pain with swallowing. Onset abrupt beginning today. No respiratory symptoms. Normal PO intake but reports discomfort with swallowing. Fever reported: yes, subjective with chills. No associated n/v/abdominal symptoms. Sick contacts: none known.  OTC treatment: analgesic with some relief.  ROS: As per HPI.   OBJECTIVE:  Vitals:   09/11/18 1817  BP: 113/73  Pulse: (!) 115  Resp: 16  Temp: (!) 102.3 F (39.1 C)  TempSrc: Oral  SpO2: 98%    Tachycardia noted. Fever noted.  General appearance: alert; no distress HEENT: throat with tonsillar hypertrophy, moderate erythema and exudates present; uvula midline: yes Neck: supple with FROM; small bilateral cervical LAD; tender Lungs: clear to auscultation bilaterally Skin: reveals no rash; warm and  dry Psychological: alert and cooperative; normal mood and affect  Allergies  Allergen Reactions  . Dextromethorphan Nausea And Vomiting  . Lactose Intolerance (Gi) Nausea And Vomiting  . Latex Rash    Past Medical History:  Diagnosis Date  . Anxiety   . Depression   . Fibroids   . H/O varicella    As a child  . Herpes    x 10 years  . Infertility, female   . Seasonal allergies   . Urinary tract infection    Social History   Socioeconomic History  . Marital status: Divorced    Spouse name: Not on file  . Number of children: Not on file  . Years of education: Not on file  . Highest education level: Not on file  Occupational History  . Not on file  Social Needs  . Financial resource strain: Not on file  . Food insecurity:    Worry: Not on file    Inability: Not on file  . Transportation needs:    Medical: Not on file    Non-medical: Not on file  Tobacco Use  . Smoking status: Never Smoker  . Smokeless tobacco: Never Used  Substance and Sexual Activity  . Alcohol use: No  . Drug use: No  . Sexual activity: Yes    Birth control/protection: I.U.D.    Comment: MIRENA  Lifestyle  . Physical activity:    Days per week: Not on file    Minutes per session: Not on file  . Stress: Not on file  Relationships  .  Social connections:    Talks on phone: Not on file    Gets together: Not on file    Attends religious service: Not on file    Active member of club or organization: Not on file    Attends meetings of clubs or organizations: Not on file    Relationship status: Not on file  . Intimate partner violence:    Fear of current or ex partner: Not on file    Emotionally abused: Not on file    Physically abused: Not on file    Forced sexual activity: Not on file  Other Topics Concern  . Not on file  Social History Narrative  . Not on file   Family History  Problem Relation Age of Onset  . Hypertension Mother   . Cancer Father        Leukemia  . Heart  disease Maternal Grandmother   . Diabetes Maternal Grandmother   . Hypertension Maternal Grandmother   . COPD Maternal Grandmother   . Kidney disease Maternal Grandmother   . Heart disease Maternal Grandfather   . Hypertension Maternal Grandfather   . COPD Maternal Grandfather   . Alcohol abuse Maternal Grandfather   . Stroke Paternal Grandmother   . Hypertension Paternal Grandmother   . Cancer Maternal Aunt        Pancreatic  . Diabetes Maternal Uncle   . Diabetes Paternal Aunt   . Hypertension Paternal Grandfather   . Stroke Paternal Reymundo Poll, MD 09/14/18 [phone]

## 2018-11-02 MED FILL — SPIRONOLACTONE 50 MG TABLET: 50 | 30 days supply | Qty: 30 | Fill #0

## 2018-12-01 MED FILL — SPIRONOLACTONE 50 MG TABLET: 50 | 30 days supply | Qty: 30 | Fill #1

## 2019-10-28 ENCOUNTER — Ambulatory Visit: Payer: Self-pay

## 2022-08-15 ENCOUNTER — Other Ambulatory Visit: Payer: Self-pay | Admitting: Obstetrics and Gynecology

## 2022-08-20 ENCOUNTER — Ambulatory Visit: Admit: 2022-08-20 | Payer: Self-pay | Admitting: Obstetrics and Gynecology

## 2022-08-20 SURGERY — DILATATION AND CURETTAGE /HYSTEROSCOPY
Anesthesia: Choice

## 2022-08-25 ENCOUNTER — Other Ambulatory Visit: Payer: Self-pay | Admitting: Obstetrics and Gynecology

## 2022-10-16 ENCOUNTER — Encounter (HOSPITAL_COMMUNITY): Payer: Self-pay | Admitting: Obstetrics and Gynecology

## 2022-10-16 NOTE — Progress Notes (Signed)
PCP - Dr Gaynelle Arabian Cardiologist - n/a  Chest x-ray - n/a EKG - n/a Stress Test - n/a ECHO - n/a Cardiac Cath - n/a  ICD Pacemaker/Loop - n/a  Sleep Study -  n/a CPAP - none  ERAS: Clear liquids til 11:30 AM DOS  STOP now taking any Aspirin (unless otherwise instructed by your surgeon), Aleve, Naproxen, Ibuprofen, Motrin, Advil, Goody's, BC's, all herbal medications, fish oil, and all vitamins.   Coronavirus Screening Do you have any of the following symptoms:  Cough yes/no: No Fever (>100.62F)  yes/no: No Runny nose yes/no: No Sore throat yes/no: No Difficulty breathing/shortness of breath  yes/no: No  Have you traveled in the last 14 days and where? yes/no: No  Patient verbalized understanding of instructions that were given via phone.  Requested surgical orders on 10/16/22 via ib message to MD.

## 2022-10-20 ENCOUNTER — Encounter (HOSPITAL_COMMUNITY)
Admission: RE | Disposition: A | Discharge status: Home / Self Care | Payer: Self-pay | Source: Home / Self Care | Attending: Obstetrics and Gynecology

## 2022-10-20 ENCOUNTER — Encounter (HOSPITAL_COMMUNITY): Payer: Self-pay | Admitting: Obstetrics and Gynecology

## 2022-10-20 ENCOUNTER — Other Ambulatory Visit: Payer: Self-pay

## 2022-10-20 ENCOUNTER — Ambulatory Visit (HOSPITAL_COMMUNITY): Payer: BC Managed Care – PPO | Admitting: Anesthesiology

## 2022-10-20 ENCOUNTER — Ambulatory Visit (HOSPITAL_COMMUNITY)
Admission: RE | Admit: 2022-10-20 | Discharge: 2022-10-20 | Disposition: A | Discharge status: Home / Self Care | Payer: BC Managed Care – PPO | Attending: Obstetrics and Gynecology | Admitting: Obstetrics and Gynecology

## 2022-10-20 DIAGNOSIS — Z01818 Encounter for other preprocedural examination: Secondary | ICD-10-CM

## 2022-10-20 DIAGNOSIS — Z8041 Family history of malignant neoplasm of ovary: Secondary | ICD-10-CM | POA: Diagnosis not present

## 2022-10-20 DIAGNOSIS — N841 Polyp of cervix uteri: Secondary | ICD-10-CM | POA: Insufficient documentation

## 2022-10-20 DIAGNOSIS — Z302 Encounter for sterilization: Secondary | ICD-10-CM | POA: Diagnosis present

## 2022-10-20 HISTORY — DX: Hyperlipidemia, unspecified: E78.5

## 2022-10-20 HISTORY — PX: HYSTEROSCOPY WITH D & C: SHX1775

## 2022-10-20 HISTORY — PX: LAPAROSCOPIC BILATERAL SALPINGECTOMY: SHX5889

## 2022-10-20 HISTORY — DX: Personal history of urinary calculi: Z87.442

## 2022-10-20 LAB — TYPE AND SCREEN
ABO/RH(D): O POS
Antibody Screen: NEGATIVE

## 2022-10-20 LAB — CBC
HCT: 37.1 % (ref 36.0–46.0)
Hemoglobin: 12.2 g/dL (ref 12.0–15.0)
MCH: 31 pg (ref 26.0–34.0)
MCHC: 32.9 g/dL (ref 30.0–36.0)
MCV: 94.2 fL (ref 80.0–100.0)
Platelets: 237 10*3/uL (ref 150–400)
RBC: 3.94 MIL/uL (ref 3.87–5.11)
RDW: 12.4 % (ref 11.5–15.5)
WBC: 5.9 10*3/uL (ref 4.0–10.5)
nRBC: 0 % (ref 0.0–0.2)

## 2022-10-20 LAB — HIV ANTIBODY (ROUTINE TESTING W REFLEX): HIV Screen 4th Generation wRfx: NONREACTIVE

## 2022-10-20 LAB — POCT PREGNANCY, URINE: Preg Test, Ur: NEGATIVE

## 2022-10-20 SURGERY — DILATATION AND CURETTAGE /HYSTEROSCOPY
Anesthesia: General

## 2022-10-20 MED ORDER — LACTATED RINGERS IV SOLN: INTRAVENOUS | Status: DC

## 2022-10-20 MED ORDER — AMISULPRIDE (ANTIEMETIC) 5 MG/2ML IV SOLN
10.0000 mg | Freq: Once | INTRAVENOUS | Status: AC | PRN
  Administered 2022-10-20: 10 mg via INTRAVENOUS

## 2022-10-20 MED ORDER — KETOROLAC TROMETHAMINE 30 MG/ML IJ SOLN
INTRAMUSCULAR | Status: DC | PRN
  Administered 2022-10-20: 30 mg via INTRAVENOUS

## 2022-10-20 MED ORDER — SUGAMMADEX SODIUM 200 MG/2ML IV SOLN
INTRAVENOUS | Status: DC | PRN
  Administered 2022-10-20: 200 mg via INTRAVENOUS

## 2022-10-20 MED ORDER — DEXAMETHASONE SODIUM PHOSPHATE 10 MG/ML IJ SOLN
INTRAMUSCULAR | Status: DC | PRN
  Administered 2022-10-20: 5 mg via INTRAVENOUS

## 2022-10-20 MED ORDER — FENTANYL CITRATE (PF) 250 MCG/5ML IJ SOLN
INTRAMUSCULAR | Status: AC
  Filled 2022-10-20: qty 5

## 2022-10-20 MED ORDER — PROMETHAZINE HCL 25 MG/ML IJ SOLN: 6.2500 mg | INTRAMUSCULAR | Status: DC | PRN

## 2022-10-20 MED ORDER — POVIDONE-IODINE 10 % EX SWAB
2.0000 | Freq: Once | CUTANEOUS | Status: AC
  Administered 2022-10-20: 2 via TOPICAL

## 2022-10-20 MED ORDER — ACETAMINOPHEN 500 MG PO TABS: 1000.0000 mg | ORAL_TABLET | Freq: Once | ORAL | Status: AC

## 2022-10-20 MED ORDER — CHLORHEXIDINE GLUCONATE 0.12 % MT SOLN
OROMUCOSAL | Status: AC
  Administered 2022-10-20: 15 mL via OROMUCOSAL
  Filled 2022-10-20: qty 15

## 2022-10-20 MED ORDER — MIDAZOLAM HCL 2 MG/2ML IJ SOLN
INTRAMUSCULAR | Status: DC | PRN
  Administered 2022-10-20: 2 mg via INTRAVENOUS

## 2022-10-20 MED ORDER — AMISULPRIDE (ANTIEMETIC) 5 MG/2ML IV SOLN
INTRAVENOUS | Status: AC
  Filled 2022-10-20: qty 4

## 2022-10-20 MED ORDER — CHLORHEXIDINE GLUCONATE 0.12 % MT SOLN: 15.0000 mL | Freq: Once | OROMUCOSAL | Status: AC

## 2022-10-20 MED ORDER — IBUPROFEN 600 MG PO TABS: 600.0000 mg | ORAL_TABLET | Freq: Four times a day (QID) | ORAL | 0 refills | Status: AC | PRN

## 2022-10-20 MED ORDER — SIMETHICONE 80 MG PO CHEW: 80.0000 mg | CHEWABLE_TABLET | Freq: Four times a day (QID) | ORAL | 0 refills | Status: AC | PRN

## 2022-10-20 MED ORDER — LIDOCAINE 2% (20 MG/ML) 5 ML SYRINGE
INTRAMUSCULAR | Status: DC | PRN
  Administered 2022-10-20: 40 mg via INTRAVENOUS

## 2022-10-20 MED ORDER — CEFAZOLIN SODIUM-DEXTROSE 2-4 GM/100ML-% IV SOLN
2.0000 g | INTRAVENOUS | Status: AC
  Administered 2022-10-20: 2 g via INTRAVENOUS

## 2022-10-20 MED ORDER — BUPIVACAINE HCL (PF) 0.25 % IJ SOLN
INTRAMUSCULAR | Status: DC | PRN
  Administered 2022-10-20: 15 mL

## 2022-10-20 MED ORDER — ONDANSETRON HCL 4 MG/2ML IJ SOLN
INTRAMUSCULAR | Status: AC
  Filled 2022-10-20: qty 2

## 2022-10-20 MED ORDER — DEXAMETHASONE SODIUM PHOSPHATE 10 MG/ML IJ SOLN
INTRAMUSCULAR | Status: AC
  Filled 2022-10-20: qty 1

## 2022-10-20 MED ORDER — SODIUM CHLORIDE 0.9 % IR SOLN
Status: DC | PRN
  Administered 2022-10-20: 1000 mL

## 2022-10-20 MED ORDER — ORAL CARE MOUTH RINSE: 15.0000 mL | Freq: Once | OROMUCOSAL | Status: AC

## 2022-10-20 MED ORDER — OXYCODONE HCL 5 MG PO TABS: 5.0000 mg | ORAL_TABLET | Freq: Four times a day (QID) | ORAL | 0 refills | Status: AC | PRN

## 2022-10-20 MED ORDER — MIDAZOLAM HCL 2 MG/2ML IJ SOLN
INTRAMUSCULAR | Status: AC
  Filled 2022-10-20: qty 2

## 2022-10-20 MED ORDER — KETOROLAC TROMETHAMINE 30 MG/ML IJ SOLN
INTRAMUSCULAR | Status: AC
  Filled 2022-10-20: qty 1

## 2022-10-20 MED ORDER — PROPOFOL 10 MG/ML IV BOLUS
INTRAVENOUS | Status: AC
  Filled 2022-10-20: qty 20

## 2022-10-20 MED ORDER — SODIUM CHLORIDE 0.9 % IV SOLN: INTRAVENOUS | Status: DC

## 2022-10-20 MED ORDER — BUPIVACAINE HCL (PF) 0.25 % IJ SOLN
INTRAMUSCULAR | Status: AC
  Filled 2022-10-20: qty 30

## 2022-10-20 MED ORDER — ROCURONIUM BROMIDE 10 MG/ML (PF) SYRINGE
PREFILLED_SYRINGE | INTRAVENOUS | Status: DC | PRN
  Administered 2022-10-20: 10 mg via INTRAVENOUS
  Administered 2022-10-20: 50 mg via INTRAVENOUS

## 2022-10-20 MED ORDER — LIDOCAINE 2% (20 MG/ML) 5 ML SYRINGE
INTRAMUSCULAR | Status: AC
  Filled 2022-10-20: qty 5

## 2022-10-20 MED ORDER — PROPOFOL 10 MG/ML IV BOLUS
INTRAVENOUS | Status: DC | PRN
  Administered 2022-10-20: 130 mg via INTRAVENOUS

## 2022-10-20 MED ORDER — CEFAZOLIN SODIUM-DEXTROSE 2-4 GM/100ML-% IV SOLN
INTRAVENOUS | Status: AC
  Filled 2022-10-20: qty 100

## 2022-10-20 MED ORDER — ONDANSETRON HCL 4 MG/2ML IJ SOLN
INTRAMUSCULAR | Status: DC | PRN
  Administered 2022-10-20: 4 mg via INTRAVENOUS

## 2022-10-20 MED ORDER — FENTANYL CITRATE (PF) 100 MCG/2ML IJ SOLN
INTRAMUSCULAR | Status: DC | PRN
  Administered 2022-10-20 (×2): 50 ug via INTRAVENOUS

## 2022-10-20 MED ORDER — ACETAMINOPHEN 500 MG PO TABS
ORAL_TABLET | ORAL | Status: AC
  Administered 2022-10-20: 1000 mg via ORAL
  Filled 2022-10-20: qty 2

## 2022-10-20 MED ORDER — ALBUTEROL SULFATE HFA 108 (90 BASE) MCG/ACT IN AERS
INHALATION_SPRAY | RESPIRATORY_TRACT | Status: DC | PRN
  Administered 2022-10-20: 2 via RESPIRATORY_TRACT

## 2022-10-20 MED ORDER — EPHEDRINE SULFATE-NACL 50-0.9 MG/10ML-% IV SOSY
PREFILLED_SYRINGE | INTRAVENOUS | Status: DC | PRN
  Administered 2022-10-20 (×2): 5 mg via INTRAVENOUS
  Administered 2022-10-20: 10 mg via INTRAVENOUS

## 2022-10-20 MED ORDER — FENTANYL CITRATE (PF) 100 MCG/2ML IJ SOLN: 25.0000 ug | INTRAMUSCULAR | Status: DC | PRN

## 2022-10-20 MED ORDER — SCOPOLAMINE 1 MG/3DAYS TD PT72
1.0000 | MEDICATED_PATCH | TRANSDERMAL | Status: DC
  Administered 2022-10-20: 1.5 mg via TRANSDERMAL
  Filled 2022-10-20: qty 1

## 2022-10-20 SURGICAL SUPPLY — 53 items
ADH SKN CLS APL DERMABOND .7 (GAUZE/BANDAGES/DRESSINGS) ×2
ADH SKN CLS LQ APL DERMABOND (GAUZE/BANDAGES/DRESSINGS) ×2
CATH ROBINSON RED A/P 16FR (CATHETERS) ×2 IMPLANT
DERMABOND ADVANCED .7 DNX12 (GAUZE/BANDAGES/DRESSINGS) ×2 IMPLANT
DERMABOND ADVANCED .7 DNX6 (GAUZE/BANDAGES/DRESSINGS) IMPLANT
DEVICE MYOSURE LITE (MISCELLANEOUS) IMPLANT
DEVICE MYOSURE REACH (MISCELLANEOUS) IMPLANT
DILATOR CANAL MILEX (MISCELLANEOUS) IMPLANT
DRSG OPSITE POSTOP 3X4 (GAUZE/BANDAGES/DRESSINGS) IMPLANT
DRSG TELFA 3X8 NADH STRL (GAUZE/BANDAGES/DRESSINGS) ×2 IMPLANT
DURAPREP 26ML APPLICATOR (WOUND CARE) ×2 IMPLANT
GAUZE 4X4 16PLY ~~LOC~~+RFID DBL (SPONGE) ×4 IMPLANT
GLOVE BIO SURGEON STRL SZ 6.5 (GLOVE) ×2 IMPLANT
GLOVE BIO SURGEON STRL SZ7 (GLOVE) ×4 IMPLANT
GLOVE BIOGEL PI IND STRL 7.0 (GLOVE) ×8 IMPLANT
GOWN STRL REUS W/ TWL LRG LVL3 (GOWN DISPOSABLE) ×4 IMPLANT
GOWN STRL REUS W/ TWL XL LVL3 (GOWN DISPOSABLE) ×2 IMPLANT
GOWN STRL REUS W/TWL LRG LVL3 (GOWN DISPOSABLE) ×6 IMPLANT
GOWN STRL REUS W/TWL XL LVL3 (GOWN DISPOSABLE) ×2
IRRIG SUCT STRYKERFLOW 2 WTIP (MISCELLANEOUS)
IRRIGATION SUCT STRKRFLW 2 WTP (MISCELLANEOUS) IMPLANT
KIT PROCEDURE FLUENT (KITS) ×2 IMPLANT
KIT TURNOVER KIT A (KITS) ×2 IMPLANT
KIT TURNOVER KIT B (KITS) ×2 IMPLANT
LIGASURE VESSEL 5MM BLUNT TIP (ELECTROSURGICAL) IMPLANT
LOOP CUTTING BIPOLAR 21FR (ELECTRODE) IMPLANT
NDL INSUFFLATION 14GA 120MM (NEEDLE) ×2 IMPLANT
NEEDLE INSUFFLATION 14GA 120MM (NEEDLE) ×2 IMPLANT
NS IRRIG 1000ML POUR BTL (IV SOLUTION) ×2 IMPLANT
PACK LAPAROSCOPY BASIN (CUSTOM PROCEDURE TRAY) ×2 IMPLANT
PACK TRENDGUARD 450 HYBRID PRO (MISCELLANEOUS) IMPLANT
PACK VAGINAL MINOR WOMEN LF (CUSTOM PROCEDURE TRAY) ×2 IMPLANT
PAD OB MATERNITY 4.3X12.25 (PERSONAL CARE ITEMS) ×2 IMPLANT
PAD PREP 24X48 CUFFED NSTRL (MISCELLANEOUS) ×2 IMPLANT
PROTECTOR NERVE ULNAR (MISCELLANEOUS) ×4 IMPLANT
SCISSORS LAP 5X35 DISP (ENDOMECHANICALS) IMPLANT
SEAL ROD LENS SCOPE MYOSURE (ABLATOR) ×2 IMPLANT
SET TUBE SMOKE EVAC HIGH FLOW (TUBING) ×2 IMPLANT
SLEEVE Z-THREAD 5X100MM (TROCAR) ×2 IMPLANT
SUT MNCRL AB 3-0 PS2 27 (SUTURE) ×4 IMPLANT
SUT VICRYL 0 ENDOLOOP (SUTURE) IMPLANT
SUT VICRYL 0 UR6 27IN ABS (SUTURE) ×2 IMPLANT
SYR 20ML LL LF (SYRINGE) IMPLANT
SYR 50ML LL SCALE MARK (SYRINGE) IMPLANT
SYS BAG RETRIEVAL 10MM (BASKET)
SYSTEM BAG RETRIEVAL 10MM (BASKET) IMPLANT
TOWEL GREEN STERILE FF (TOWEL DISPOSABLE) ×4 IMPLANT
TRAY FOLEY W/BAG SLVR 14FR (SET/KITS/TRAYS/PACK) ×2 IMPLANT
TRENDGUARD 450 HYBRID PRO PACK (MISCELLANEOUS)
TROCAR 11X100 Z THREAD (TROCAR) ×2 IMPLANT
TROCAR XCEL NON-BLD 5MMX100MML (ENDOMECHANICALS) ×2 IMPLANT
WARMER LAPAROSCOPE (MISCELLANEOUS) ×2 IMPLANT
WATER STERILE IRR 500ML POUR (IV SOLUTION) ×2 IMPLANT

## 2022-10-20 NOTE — Anesthesia Postprocedure Evaluation (Signed)
Anesthesia Post Note  Patient: Wendy Travis  Procedure(s) Performed: DILATATION AND CURETTAGE /HYSTEROSCOPY BIOPSY OF ENDOMETRIUM POLYPECTOMY LAPAROSCOPIC BILATERAL SALPINGECTOMY (Bilateral)     Patient location during evaluation: PACU Anesthesia Type: General Level of consciousness: awake and alert Pain management: pain level controlled Vital Signs Assessment: post-procedure vital signs reviewed and stable Respiratory status: spontaneous breathing, nonlabored ventilation and respiratory function stable Cardiovascular status: blood pressure returned to baseline and stable Postop Assessment: no apparent nausea or vomiting Anesthetic complications: no  No notable events documented.  Last Vitals:  Vitals:   10/20/22 1645 10/20/22 1700  BP: 116/65 108/69  Pulse: 75 72  Resp: 17 11  Temp:  36.8 C  SpO2: 98% 100%    Last Pain:  Vitals:   10/20/22 1700  TempSrc:   PainSc: 0-No pain                 Levan Aloia,W. EDMOND

## 2022-10-20 NOTE — H&P (Signed)
Wendy Travis is an 47 y.o. female G1P1011 presenting for sterilization with bilateral salpingectomy as she also has family history of ovarian cancer. Patient also has endocervical polyp cause irregular bleeding pattern and desires removal. Counseled on D&C, hysteroscopy with Myosure, polypectomy and laparoscopic bilateral salpingectomy procedures discussed at length. Discussed R/B/A. Risk included, but not limited to infection, bleeding, damage to surrounding structures, uterine perforation. All questions answered and informed verbal consent obtained.   Pertinent Gynecological History: Menses: flow is moderate Bleeding: intermenstrual bleeding Contraception: none DES exposure: unknown Blood transfusions: none Sexually transmitted diseases: no past history Previous GYN Procedures:  none   Last mammogram: normal Date: 2023 Last pap: normal Date: 06/2022 OB History: G2, P1011   Menstrual History: Menarche age: 11 Patient's last menstrual period was 10/15/2022 (exact date).    Past Medical History:  Diagnosis Date   Anxiety    Depression    Fibroids    H/O varicella    As a child   Herpes    fever blisters none since > 10 years fever blisters   History of kidney stones    passed stone   HLD (hyperlipidemia)    no meds, diet controlled   Infertility, female    Seasonal allergies    Urinary tract infection     Past Surgical History:  Procedure Laterality Date   WISDOM TOOTH EXTRACTION      Family History  Problem Relation Age of Onset   Hypertension Mother    Cancer Father        Leukemia   Heart disease Maternal Grandmother    Diabetes Maternal Grandmother    Hypertension Maternal Grandmother    COPD Maternal Grandmother    Kidney disease Maternal Grandmother    Heart disease Maternal Grandfather    Hypertension Maternal Grandfather    COPD Maternal Grandfather    Alcohol abuse Maternal Grandfather    Stroke Paternal Grandmother    Hypertension Paternal  Grandmother    Cancer Maternal Aunt        Pancreatic   Diabetes Maternal Uncle    Diabetes Paternal Aunt    Hypertension Paternal Grandfather    Stroke Paternal Grandfather     Social History:  reports that she has never smoked. She has never used smokeless tobacco. She reports current alcohol use of about 5.0 standard drinks of alcohol per week. She reports that she does not use drugs.  Allergies:  Allergies  Allergen Reactions   Dextromethorphan Nausea And Vomiting   Lactose Intolerance (Gi) Diarrhea and Nausea And Vomiting   Minocycline Diarrhea   Tessalon [Benzonatate]     Itchy throat, sweating, hallucinations    Latex Rash    No medications prior to admission.    Review of Systems  Constitutional: Negative.   HENT: Negative.    Eyes: Negative.   Respiratory: Negative.    Cardiovascular: Negative.   Gastrointestinal: Negative.   Endocrine: Negative.   Genitourinary: Negative.   All other systems reviewed and are negative.   Height '5\' 6"'$  (1.676 m), weight 70.3 kg, last menstrual period 10/15/2022. Physical Exam Vitals reviewed.  Constitutional:      Appearance: Normal appearance.  Cardiovascular:     Rate and Rhythm: Normal rate.  Pulmonary:     Effort: Pulmonary effort is normal.     Breath sounds: Normal breath sounds.  Abdominal:     General: Abdomen is flat. There is no distension.     Palpations: Abdomen is soft.     Tenderness: There  is no abdominal tenderness. There is no guarding.  Genitourinary:    General: Normal vulva.  Skin:    General: Skin is warm and dry.  Neurological:     General: No focal deficit present.     Mental Status: She is alert and oriented to person, place, and time.  Psychiatric:        Mood and Affect: Mood normal.     No results found for this or any previous visit (from the past 24 hour(s)).  No results found.  Assessment/Plan: 47 y.o. female G89P1011 Desires sterilization Strong family h/o cancer Cervical  polyp  Will schedule D&C, hysteroscopy w/ myosure, polypectomy, and laparoscopic bilateral salpingectomy. F/u in 2 weeks post procedure.   Josefine Class 10/20/2022, 9:47 AM

## 2022-10-20 NOTE — Transfer of Care (Signed)
Immediate Anesthesia Transfer of Care Note  Patient: Wendy Travis  Procedure(s) Performed: DILATATION AND CURETTAGE /HYSTEROSCOPY BIOPSY OF ENDOMETRIUM POLYPECTOMY LAPAROSCOPIC BILATERAL SALPINGECTOMY (Bilateral)  Patient Location: PACU  Anesthesia Type:General  Level of Consciousness: awake and oriented  Airway & Oxygen Therapy: Patient Spontanous Breathing  Post-op Assessment: Report given to RN  Post vital signs: Reviewed and stable  Last Vitals:  Vitals Value Taken Time  BP 123/68   Temp    Pulse 80 10/20/22 1632  Resp 23 10/20/22 1632  SpO2 100 % 10/20/22 1632  Vitals shown include unvalidated device data.  Last Pain:  Vitals:   10/20/22 1249  TempSrc:   PainSc: 3          Complications: No notable events documented.

## 2022-10-20 NOTE — Anesthesia Procedure Notes (Signed)
Procedure Name: Intubation Date/Time: 10/20/2022 2:58 PM  Performed by: Barrington Ellison, CRNAPre-anesthesia Checklist: Patient identified, Emergency Drugs available, Suction available and Patient being monitored Patient Re-evaluated:Patient Re-evaluated prior to induction Oxygen Delivery Method: Circle System Utilized Preoxygenation: Pre-oxygenation with 100% oxygen Induction Type: IV induction Ventilation: Mask ventilation without difficulty Laryngoscope Size: Mac and 3 Grade View: Grade I Tube type: Oral Tube size: 6.5 mm Number of attempts: 1 Airway Equipment and Method: Stylet and Oral airway Placement Confirmation: ETT inserted through vocal cords under direct vision, positive ETCO2 and breath sounds checked- equal and bilateral Secured at: 21 cm Tube secured with: Tape Dental Injury: Teeth and Oropharynx as per pre-operative assessment

## 2022-10-20 NOTE — Anesthesia Preprocedure Evaluation (Addendum)
Anesthesia Evaluation  Patient identified by MRN, date of birth, ID band Patient awake    Reviewed: Allergy & Precautions, NPO status , Patient's Chart, lab work & pertinent test results  History of Anesthesia Complications Negative for: history of anesthetic complications  Airway Mallampati: II  TM Distance: >3 FB Neck ROM: Full    Dental no notable dental hx. (+) Dental Advisory Given   Pulmonary neg pulmonary ROS   Pulmonary exam normal        Cardiovascular negative cardio ROS Normal cardiovascular exam     Neuro/Psych negative neurological ROS     GI/Hepatic negative GI ROS, Neg liver ROS,,,  Endo/Other  negative endocrine ROS    Renal/GU negative Renal ROS     Musculoskeletal negative musculoskeletal ROS (+)    Abdominal   Peds  Hematology negative hematology ROS (+)   Anesthesia Other Findings   Reproductive/Obstetrics                             Anesthesia Physical Anesthesia Plan  ASA: 2  Anesthesia Plan: General   Post-op Pain Management: Tylenol PO (pre-op)* and Toradol IV (intra-op)*   Induction: Intravenous  PONV Risk Score and Plan: 4 or greater and Ondansetron, Dexamethasone, Midazolam, Scopolamine patch - Pre-op, Propofol infusion and TIVA  Airway Management Planned: Oral ETT  Additional Equipment:   Intra-op Plan:   Post-operative Plan: Extubation in OR  Informed Consent: I have reviewed the patients History and Physical, chart, labs and discussed the procedure including the risks, benefits and alternatives for the proposed anesthesia with the patient or authorized representative who has indicated his/her understanding and acceptance.     Dental advisory given  Plan Discussed with: Anesthesiologist and CRNA  Anesthesia Plan Comments:        Anesthesia Quick Evaluation

## 2022-10-20 NOTE — Op Note (Signed)
Preop Diagnosis: POLYP OF CERVIX / STERILIZATION   Postop Diagnosis: POLYP OF CERVIX / STERILIZATION   Procedure: DILATATION AND CURETTAGE /HYSTEROSCOPY LAPAROSCOPIC BILATERAL SALPINGECTOMY   Anesthesia: General   Anesthesiologist: Roderic Palau, MD   Attending: Josefine Class, MD   Assistant: Everett Graff, MD  Findings: Thickened endometrial lining with 1x2 cm endocervical polyp; on laparoscopy noted normal appearing bilateral fallopian tubes, ovaries and uterus  Pathology: Endometrial curettings, endocervical polyp, bilateral fallopian tubes  Fluids: 500 mL  UOP: 350 mL  EBL: 10 mL  Complications: None  Procedure:  The patient was taken to the operating room after the risks, benefits, alternatives, complications, treatment options, and expected outcomes were discussed with the patient. The patient verbalized understanding, the patient concurred with the proposed plan and consent signed and witnessed. The patient was taken to the Operating Room, identified as Room 7 and the procedure verified as D&C, hysteroscopy w/ myosure, polypectomy, and laparoscopic bilateral salpingectomy . A Time Out was held and the above information confirmed.  The patient was placed under general anesthesia per anesthesia staff, the patient was placed in modified dorsal lithotomy position and was prepped, draped, and catheterized in the normal, sterile fashion.  A weighted speculum was placed into the vagina and anterior lip of the cervix was grasped with a single-tooth tenaculum.  The uterus sounded to 33m. The cervix was then dilated with Pratt dilators up to 19. The Myosure hysteroscope was placed into the uterine cavity. The  entire uterus and both ostia were visualized. Endometrium was noted to be thickened and irregular with no active bleeding. Myosure lite device placed in hysteroscopy and curettage was performed. Fluid deficit was <200 mL. Hysteroscope was then removed from the uterus.   Endocervical polyp removed with ring forceps. Base of polyp noted to be hemostatic. A sharp curettage was then done with a curette. All the endometrial curettings were sent to pathology. The hysteroscope was not reintroduced.   A Hulka intrauterine manipulator was placed. A  5 mm supra-umbilical incision was then performed. Veress needle was passed and pneumoperitoneum was established. A 5 mm trocar was advanced into the intra-abdominal cavity, the straight laparoscope was introduced and findings as noted above.  A 5 mm incision was made in the RLQ and LLQ 5 mm trocar advanced into the intra-abdominal cavity under direct visualization x 2. The right fallopian tube was identified and carried out to its fimbriated end and excised with the ligasure.  The same was done on the contralateral side.  Excellent hemostasis was confirmed. The 5 mm RLQ and LLQ trocars were removed under direct visualization.  Pneumoperitoneum was released and laparoscope was removed under direct visualization along with 5 mm supra-umbilical trocar.  The skin was reapproximated with 4-0 monocryl via a subcuticular stitch and dermabond was applied.  The hulka tenaculum and foley were removed.  Sponge, lap and needle count were correct.  Patient tolerated the procedure well and was returned to the recovery room in good condition.    PLAN OF CARE: discharge to home  PATIENT DISPOSITION:  PACU - hemodynamically stable.   Dr. EBing Matter

## 2022-10-21 ENCOUNTER — Encounter (HOSPITAL_COMMUNITY): Payer: Self-pay | Admitting: Obstetrics and Gynecology

## 2022-10-21 LAB — RPR: RPR Ser Ql: NONREACTIVE

## 2022-10-22 LAB — SURGICAL PATHOLOGY

## 2023-04-03 ENCOUNTER — Other Ambulatory Visit: Payer: Self-pay | Admitting: Obstetrics and Gynecology

## 2023-04-03 ENCOUNTER — Other Ambulatory Visit: Payer: Self-pay

## 2023-04-03 DIAGNOSIS — N939 Abnormal uterine and vaginal bleeding, unspecified: Secondary | ICD-10-CM

## 2023-04-10 ENCOUNTER — Ambulatory Visit
Admission: RE | Admit: 2023-04-10 | Discharge: 2023-04-10 | Disposition: A | Discharge status: Home / Self Care | Payer: Medicaid Other | Source: Ambulatory Visit | Attending: Obstetrics and Gynecology | Admitting: Obstetrics and Gynecology

## 2023-04-10 DIAGNOSIS — N939 Abnormal uterine and vaginal bleeding, unspecified: Secondary | ICD-10-CM

## 2023-05-07 ENCOUNTER — Other Ambulatory Visit: Payer: Self-pay | Admitting: Medical Genetics

## 2023-05-07 DIAGNOSIS — Z006 Encounter for examination for normal comparison and control in clinical research program: Secondary | ICD-10-CM

## 2023-07-13 ENCOUNTER — Other Ambulatory Visit
Admission: RE | Admit: 2023-07-13 | Discharge: 2023-07-13 | Disposition: A | Discharge status: Home / Self Care | Payer: Medicaid Other | Source: Ambulatory Visit | Attending: Medical Genetics | Admitting: Medical Genetics

## 2023-07-13 DIAGNOSIS — Z006 Encounter for examination for normal comparison and control in clinical research program: Secondary | ICD-10-CM | POA: Insufficient documentation

## 2023-07-21 LAB — HELIX MOLECULAR SCREEN: Genetic Analysis Overall Interpretation: NEGATIVE

## 2023-07-21 LAB — GENECONNECT MOLECULAR SCREEN

## 2023-09-08 LAB — COLOGUARD: COLOGUARD: NEGATIVE

## 2024-02-18 ENCOUNTER — Ambulatory Visit: Admitting: Dermatology

## 2024-02-18 ENCOUNTER — Encounter: Payer: Self-pay | Admitting: Dermatology

## 2024-02-18 VITALS — BP 120/74 | HR 83

## 2024-02-18 DIAGNOSIS — L821 Other seborrheic keratosis: Secondary | ICD-10-CM | POA: Diagnosis not present

## 2024-02-18 DIAGNOSIS — L578 Other skin changes due to chronic exposure to nonionizing radiation: Secondary | ICD-10-CM

## 2024-02-18 DIAGNOSIS — D1801 Hemangioma of skin and subcutaneous tissue: Secondary | ICD-10-CM | POA: Diagnosis not present

## 2024-02-18 DIAGNOSIS — L814 Other melanin hyperpigmentation: Secondary | ICD-10-CM | POA: Diagnosis not present

## 2024-02-18 DIAGNOSIS — Z1283 Encounter for screening for malignant neoplasm of skin: Secondary | ICD-10-CM | POA: Diagnosis not present

## 2024-02-18 DIAGNOSIS — W908XXA Exposure to other nonionizing radiation, initial encounter: Secondary | ICD-10-CM

## 2024-02-18 DIAGNOSIS — D229 Melanocytic nevi, unspecified: Secondary | ICD-10-CM

## 2024-02-18 NOTE — Progress Notes (Signed)
   New Patient Visit   Subjective  Wendy Travis is a 48 y.o. female who presents for the following: Skin Cancer Screening and Full Body Skin Exam  The patient presents for Total-Body Skin Exam (TBSE) for skin cancer screening and mole check. The patient has spots, moles and lesions to be evaluated, some may be new or changing.  Pt has not hx of skin cancer nor family hx  The following portions of the chart were reviewed this encounter and updated as appropriate: medications, allergies, medical history  Review of Systems:  No other skin or systemic complaints except as noted in HPI or Assessment and Plan.  Objective  Well appearing patient in no apparent distress; mood and affect are within normal limits.  A full examination was performed including scalp, head, eyes, ears, nose, lips, neck, chest, axillae, abdomen, back, buttocks, bilateral upper extremities, bilateral lower extremities, hands, feet, fingers, toes, fingernails, and toenails. All findings within normal limits unless otherwise noted below.   Relevant physical exam findings are noted in the Assessment and Plan.    Assessment & Plan   SKIN CANCER SCREENING PERFORMED TODAY.  ACTINIC DAMAGE - Chronic condition, secondary to cumulative UV/sun exposure - diffuse scaly erythematous macules with underlying dyspigmentation - Recommend daily broad spectrum sunscreen SPF 30+ to sun-exposed areas, reapply every 2 hours as needed.  - Staying in the shade or wearing long sleeves, sun glasses (UVA+UVB protection) and wide brim hats (4-inch brim around the entire circumference of the hat) are also recommended for sun protection.  - Call for new or changing lesions.  MELANOCYTIC NEVI - Tan-brown and/or pink-flesh-colored symmetric macules and papules - Benign appearing on exam today - Observation - Call clinic for new or changing moles - Recommend daily use of broad spectrum spf 30+ sunscreen to sun-exposed areas.    SEBORRHEIC KERATOSIS - Stuck-on, waxy, tan-brown papules and/or plaques  - Benign-appearing - Discussed benign etiology and prognosis. - Observe - Call for any changes  LENTIGINES Exam: scattered tan macules Due to sun exposure Treatment Plan: Benign-appearing, observe. Recommend daily broad spectrum sunscreen SPF 30+ to sun-exposed areas, reapply every 2 hours as needed.  Call for any changes    HEMANGIOMA Exam: red papule(s) Discussed benign nature. Recommend observation. Call for changes.     Return in about 2 years (around 02/17/2026) for TBSE.  I, Wilson Hasten, CMA, am acting as scribe for Deneise Finlay, MD.   Documentation: I have reviewed the above documentation for accuracy and completeness, and I agree with the above.  Deneise Finlay, MD

## 2024-02-18 NOTE — Patient Instructions (Signed)

## 2024-06-20 ENCOUNTER — Ambulatory Visit
Admission: RE | Admit: 2024-06-20 | Discharge: 2024-06-20 | Disposition: A | Source: Ambulatory Visit | Attending: Family Medicine | Admitting: Family Medicine

## 2024-06-20 ENCOUNTER — Other Ambulatory Visit: Payer: Self-pay | Admitting: Family Medicine

## 2024-06-20 DIAGNOSIS — M25559 Pain in unspecified hip: Secondary | ICD-10-CM

## 2024-09-28 ENCOUNTER — Other Ambulatory Visit (HOSPITAL_BASED_OUTPATIENT_CLINIC_OR_DEPARTMENT_OTHER): Payer: Self-pay | Admitting: Family Medicine

## 2024-09-28 DIAGNOSIS — E785 Hyperlipidemia, unspecified: Secondary | ICD-10-CM

## 2024-10-03 ENCOUNTER — Ambulatory Visit (INDEPENDENT_AMBULATORY_CARE_PROVIDER_SITE_OTHER)
Admission: RE | Admit: 2024-10-03 | Discharge: 2024-10-03 | Disposition: A | Payer: Self-pay | Source: Ambulatory Visit | Attending: Family Medicine | Admitting: Family Medicine

## 2024-10-03 DIAGNOSIS — E785 Hyperlipidemia, unspecified: Secondary | ICD-10-CM
